# Patient Record
Sex: Male | Born: 1955 | Race: White | Hispanic: No | Marital: Married | Smoking: Never smoker
Health system: Southern US, Community
[De-identification: ages and names within clinical notes are randomized; demographics above are authoritative.]

## PROBLEM LIST (undated history)

## (undated) DIAGNOSIS — Z87442 Personal history of urinary calculi: Secondary | ICD-10-CM

## (undated) DIAGNOSIS — M1990 Unspecified osteoarthritis, unspecified site: Secondary | ICD-10-CM

## (undated) HISTORY — PX: OTHER SURGICAL HISTORY: SHX169

## (undated) HISTORY — DX: Personal history of urinary calculi: Z87.442

## (undated) HISTORY — PX: JOINT REPLACEMENT: SHX530

---

## 1997-10-16 ENCOUNTER — Ambulatory Visit (HOSPITAL_COMMUNITY): Admission: RE | Admit: 1997-10-16 | Discharge: 1997-10-16 | Payer: Self-pay | Admitting: *Deleted

## 2000-10-16 ENCOUNTER — Inpatient Hospital Stay (HOSPITAL_COMMUNITY): Admission: EM | Admit: 2000-10-16 | Discharge: 2000-10-17 | Payer: Self-pay

## 2000-10-16 ENCOUNTER — Encounter: Payer: Self-pay | Admitting: Internal Medicine

## 2002-06-25 ENCOUNTER — Encounter: Payer: Self-pay | Admitting: Family Medicine

## 2002-06-25 ENCOUNTER — Encounter: Admission: RE | Admit: 2002-06-25 | Discharge: 2002-06-25 | Payer: Self-pay | Admitting: Family Medicine

## 2003-02-16 ENCOUNTER — Ambulatory Visit (HOSPITAL_COMMUNITY): Admission: RE | Admit: 2003-02-16 | Discharge: 2003-02-16 | Payer: Self-pay | Admitting: Orthopedic Surgery

## 2003-03-17 ENCOUNTER — Ambulatory Visit (HOSPITAL_COMMUNITY): Admission: RE | Admit: 2003-03-17 | Discharge: 2003-03-17 | Payer: Self-pay | Admitting: Orthopedic Surgery

## 2003-03-17 ENCOUNTER — Ambulatory Visit (HOSPITAL_BASED_OUTPATIENT_CLINIC_OR_DEPARTMENT_OTHER): Admission: RE | Admit: 2003-03-17 | Discharge: 2003-03-17 | Payer: Self-pay | Admitting: Orthopedic Surgery

## 2005-04-01 ENCOUNTER — Encounter: Admission: RE | Admit: 2005-04-01 | Discharge: 2005-06-30 | Payer: Self-pay | Admitting: Orthopedic Surgery

## 2019-01-01 ENCOUNTER — Other Ambulatory Visit: Payer: Self-pay | Admitting: Orthopedic Surgery

## 2019-01-01 DIAGNOSIS — M19012 Primary osteoarthritis, left shoulder: Secondary | ICD-10-CM

## 2019-01-18 ENCOUNTER — Ambulatory Visit
Admission: RE | Admit: 2019-01-18 | Discharge: 2019-01-18 | Disposition: A | Discharge status: Home / Self Care | Payer: BC Managed Care – PPO | Source: Ambulatory Visit | Attending: Orthopedic Surgery | Admitting: Orthopedic Surgery

## 2019-01-18 DIAGNOSIS — M19012 Primary osteoarthritis, left shoulder: Secondary | ICD-10-CM

## 2019-11-15 NOTE — Progress Notes (Signed)
Pt. Needs PST orders for upcoming surgery.PST appointment on 11/16/19.Thanks.

## 2019-11-15 NOTE — Patient Instructions (Addendum)
DUE TO COVID-19 ONLY ONE VISITOR IS ALLOWED TO COME WITH YOU AND STAY IN THE WAITING ROOM ONLY DURING PRE OP AND PROCEDURE DAY OF SURGERY. THE 1 VISITOR  MAY VISIT WITH YOU AFTER SURGERY IN YOUR PRIVATE ROOM DURING VISITING HOURS ONLY!  YOU NEED TO HAVE A COVID 19 TEST ON: 11/16/19 @ 8:15 am , THIS TEST MUST BE DONE BEFORE SURGERY,  COVID TESTING SITE [place] [address], IT IS ON THE RIGHT GOING OUT [address] APPROXIMATELY  2 MINUTES PAST [employer] ON THE RIGHT. ONCE YOUR COVID TEST IS COMPLETED,  PLEASE BEGIN THE QUARANTINE INSTRUCTIONS AS OUTLINED IN YOUR HANDOUT.                Veer Elamin    Your procedure is scheduled on: 11/19/19   [facility] to [facility]  Entrance   Report to short stay at: 5:30 AM     Call this number if you have problems the morning of surgery [phone]    Remember: Do not eat food or drink liquids :After Midnight.   BRUSH YOUR TEETH MORNING OF SURGERY AND RINSE YOUR MOUTH OUT, NO CHEWING GUM CANDY OR MINTS.                                 You may not have any metal on your body including hair pins and              piercings  Do not wear jewelry, lotions, powders or perfumes, deodorant             Men may shave face and neck.   Do not bring valuables to the hospital. [place].  Contacts, dentures or bridgework may not be worn into surgery.  Leave suitcase in the car. After surgery it may be brought to your room.     Patients discharged the day of surgery will not be allowed to drive home. IF YOU ARE HAVING SURGERY AND GOING HOME THE SAME DAY, YOU MUST HAVE AN ADULT TO DRIVE YOU HOME AND BE WITH YOU FOR 24 HOURS. YOU MAY GO HOME BY TAXI OR UBER OR ORTHERWISE, BUT AN ADULT MUST ACCOMPANY YOU HOME AND STAY WITH YOU FOR 24 HOURS.  Name and phone number of your driver:  Special Instructions: N/A              Please read over the following fact sheets you  were given: _____________________________________________________________________          [facility] - Preparing for Surgery Before surgery, you can play an important role.  Because skin is not sterile, your skin needs to be as free of germs as possible.  You can reduce the number of germs on your skin by washing with CHG (chlorahexidine gluconate) soap before surgery.  CHG is an antiseptic cleaner which kills germs and bonds with the skin to continue killing germs even after washing. Please DO NOT use if you have an allergy to CHG or antibacterial soaps.  If your skin becomes reddened/irritated stop using the CHG and inform your nurse when you arrive at Short Stay. Do not shave (including legs and underarms) for at least 48 hours prior to the first CHG shower.  You may shave your face/neck. Please follow these instructions carefully:  1.  Shower with CHG Soap the night before surgery and the  morning of Surgery.  2.  If you choose to wash your hair, wash your hair first as usual with your  normal  shampoo.  3.  After you shampoo, rinse your hair and body thoroughly to remove the  shampoo.                           4.  Use CHG as you would any other liquid soap.  You can apply chg directly  to the skin and wash                       Gently with a scrungie or clean washcloth.  5.  Apply the CHG Soap to your body ONLY FROM THE NECK DOWN.   Do not use on face/ open                           Wound or open sores. Avoid contact with eyes, ears mouth and genitals (private parts).                       Wash face,  Genitals (private parts) with your normal soap.             6.  Wash thoroughly, paying special attention to the area where your surgery  will be performed.  7.  Thoroughly rinse your body with warm water from the neck down.  8.  DO NOT shower/wash with your normal soap after using and rinsing off  the CHG Soap.                9.  Pat yourself dry with a clean towel.            10.  Wear clean  pajamas.            11.  Place clean sheets on your bed the night of your first shower and do not  sleep with pets. Day of Surgery : Do not apply any lotions/deodorants the morning of surgery.  Please wear clean clothes to the hospital/surgery center.  FAILURE TO FOLLOW THESE INSTRUCTIONS MAY RESULT IN THE CANCELLATION OF YOUR SURGERY PATIENT SIGNATURE_________________________________  NURSE SIGNATURE__________________________________  ________________________________________________________________________

## 2019-11-16 ENCOUNTER — Other Ambulatory Visit: Payer: Self-pay

## 2019-11-16 ENCOUNTER — Ambulatory Visit: Payer: Self-pay | Admitting: Surgery

## 2019-11-16 ENCOUNTER — Other Ambulatory Visit (HOSPITAL_COMMUNITY)
Admission: RE | Admit: 2019-11-16 | Discharge: 2019-11-16 | Disposition: A | Discharge status: Home / Self Care | Payer: BC Managed Care – PPO | Source: Ambulatory Visit | Attending: Surgery | Admitting: Surgery

## 2019-11-16 ENCOUNTER — Encounter (HOSPITAL_COMMUNITY): Payer: Self-pay

## 2019-11-16 ENCOUNTER — Encounter (HOSPITAL_COMMUNITY)
Admission: RE | Admit: 2019-11-16 | Discharge: 2019-11-16 | Disposition: A | Discharge status: Home / Self Care | Payer: BC Managed Care – PPO | Source: Ambulatory Visit | Attending: Surgery | Admitting: Surgery

## 2019-11-16 DIAGNOSIS — Z20822 Contact with and (suspected) exposure to covid-19: Secondary | ICD-10-CM | POA: Insufficient documentation

## 2019-11-16 DIAGNOSIS — Z01812 Encounter for preprocedural laboratory examination: Secondary | ICD-10-CM | POA: Diagnosis not present

## 2019-11-16 LAB — CBC
HCT: 49.8 % (ref 39.0–52.0)
Hemoglobin: 17.4 g/dL — ABNORMAL HIGH (ref 13.0–17.0)
MCH: 31.8 pg (ref 26.0–34.0)
MCHC: 34.9 g/dL (ref 30.0–36.0)
MCV: 91 fL (ref 80.0–100.0)
Platelets: 248 10*3/uL (ref 150–400)
RBC: 5.47 MIL/uL (ref 4.22–5.81)
RDW: 12.8 % (ref 11.5–15.5)
WBC: 7.3 10*3/uL (ref 4.0–10.5)
nRBC: 0 % (ref 0.0–0.2)

## 2019-11-16 LAB — SARS CORONAVIRUS 2 (TAT 6-24 HRS): SARS Coronavirus 2: NEGATIVE

## 2019-11-16 NOTE — Progress Notes (Signed)
COVID Vaccine Completed: Yes Date COVID Vaccine completed: COVID vaccine manufacturer:     Pricilla Handler  PCP - Dr. Forbes Cellar Cardiologist - NO  Chest x-ray -  EKG -  Stress Test -  ECHO -  Cardiac Cath -  Pacemaker/ICD device last checked:  Sleep Study -  CPAP -   Fasting Blood Sugar -  Checks Blood Sugar _____ times a day  Blood Thinner Instructions: Aspirin Instructions: On hold since 11/14/19 Last Dose: 11/14/19  Anesthesia review:   Patient denies shortness of breath, fever, cough and chest pain at PAT appointment   Patient verbalized understanding of instructions that were given to them at the PAT appointment. Patient was also instructed that they will need to review over the PAT instructions again at home before surgery.

## 2019-11-18 ENCOUNTER — Encounter (HOSPITAL_COMMUNITY): Payer: Self-pay | Admitting: Surgery

## 2019-11-19 ENCOUNTER — Ambulatory Visit (HOSPITAL_COMMUNITY): Payer: BC Managed Care – PPO | Admitting: Anesthesiology

## 2019-11-19 ENCOUNTER — Encounter (HOSPITAL_COMMUNITY): Payer: Self-pay | Admitting: Surgery

## 2019-11-19 ENCOUNTER — Ambulatory Visit (HOSPITAL_COMMUNITY)
Admission: RE | Admit: 2019-11-19 | Discharge: 2019-11-19 | Disposition: A | Discharge status: Home / Self Care | Payer: BC Managed Care – PPO | Attending: Surgery | Admitting: Surgery

## 2019-11-19 ENCOUNTER — Encounter (HOSPITAL_COMMUNITY)
Admission: RE | Disposition: A | Discharge status: Home / Self Care | Payer: Self-pay | Source: Home / Self Care | Attending: Surgery

## 2019-11-19 DIAGNOSIS — Z791 Long term (current) use of non-steroidal anti-inflammatories (NSAID): Secondary | ICD-10-CM | POA: Insufficient documentation

## 2019-11-19 DIAGNOSIS — D1779 Benign lipomatous neoplasm of other sites: Secondary | ICD-10-CM | POA: Diagnosis not present

## 2019-11-19 DIAGNOSIS — Z880 Allergy status to penicillin: Secondary | ICD-10-CM | POA: Diagnosis not present

## 2019-11-19 DIAGNOSIS — Z9660 Presence of unspecified orthopedic joint implant: Secondary | ICD-10-CM | POA: Insufficient documentation

## 2019-11-19 DIAGNOSIS — Z79899 Other long term (current) drug therapy: Secondary | ICD-10-CM | POA: Insufficient documentation

## 2019-11-19 DIAGNOSIS — Z881 Allergy status to other antibiotic agents status: Secondary | ICD-10-CM | POA: Insufficient documentation

## 2019-11-19 DIAGNOSIS — Z7982 Long term (current) use of aspirin: Secondary | ICD-10-CM | POA: Insufficient documentation

## 2019-11-19 DIAGNOSIS — K429 Umbilical hernia without obstruction or gangrene: Secondary | ICD-10-CM | POA: Diagnosis present

## 2019-11-19 HISTORY — PX: LIPOMA EXCISION: SHX5283

## 2019-11-19 HISTORY — PX: UMBILICAL HERNIA REPAIR: SHX196

## 2019-11-19 SURGERY — REPAIR, HERNIA, UMBILICAL, ADULT
Anesthesia: General | Site: Groin

## 2019-11-19 MED ORDER — DEXMEDETOMIDINE (PRECEDEX) IN NS 20 MCG/5ML (4 MCG/ML) IV SYRINGE
PREFILLED_SYRINGE | INTRAVENOUS | Status: DC | PRN
  Administered 2019-11-19 (×2): 4 ug via INTRAVENOUS

## 2019-11-19 MED ORDER — DEXAMETHASONE SODIUM PHOSPHATE 10 MG/ML IJ SOLN
INTRAMUSCULAR | Status: DC | PRN
  Administered 2019-11-19: 10 mg via INTRAVENOUS

## 2019-11-19 MED ORDER — MIDAZOLAM HCL 2 MG/2ML IJ SOLN
INTRAMUSCULAR | Status: AC
  Filled 2019-11-19: qty 2

## 2019-11-19 MED ORDER — EPHEDRINE 5 MG/ML INJ
INTRAVENOUS | Status: AC
  Filled 2019-11-19: qty 20

## 2019-11-19 MED ORDER — ROCURONIUM BROMIDE 10 MG/ML (PF) SYRINGE
PREFILLED_SYRINGE | INTRAVENOUS | Status: DC | PRN
  Administered 2019-11-19: 70 mg via INTRAVENOUS
  Administered 2019-11-19: 5 mg via INTRAVENOUS

## 2019-11-19 MED ORDER — LACTATED RINGERS IV SOLN: INTRAVENOUS | Status: DC

## 2019-11-19 MED ORDER — MIDAZOLAM HCL 5 MG/5ML IJ SOLN
INTRAMUSCULAR | Status: DC | PRN
  Administered 2019-11-19: 2 mg via INTRAVENOUS

## 2019-11-19 MED ORDER — HYDROMORPHONE HCL 1 MG/ML IJ SOLN: 0.2500 mg | INTRAMUSCULAR | Status: DC | PRN

## 2019-11-19 MED ORDER — FENTANYL CITRATE (PF) 100 MCG/2ML IJ SOLN
INTRAMUSCULAR | Status: DC | PRN
Start: 2019-11-19 — End: 2019-11-19
  Administered 2019-11-19: 100 ug via INTRAVENOUS
  Administered 2019-11-19: 50 ug via INTRAVENOUS

## 2019-11-19 MED ORDER — CHLORHEXIDINE GLUCONATE CLOTH 2 % EX PADS: 6.0000 | MEDICATED_PAD | Freq: Once | CUTANEOUS | Status: DC

## 2019-11-19 MED ORDER — ACETAMINOPHEN 160 MG/5ML PO SOLN: 325.0000 mg | ORAL | Status: DC | PRN

## 2019-11-19 MED ORDER — PROPOFOL 10 MG/ML IV BOLUS
INTRAVENOUS | Status: DC | PRN
  Administered 2019-11-19: 180 mg via INTRAVENOUS

## 2019-11-19 MED ORDER — BUPIVACAINE-EPINEPHRINE 0.25% -1:200000 IJ SOLN
INTRAMUSCULAR | Status: DC | PRN
  Administered 2019-11-19: 30 mL

## 2019-11-19 MED ORDER — ROCURONIUM BROMIDE 10 MG/ML (PF) SYRINGE
PREFILLED_SYRINGE | INTRAVENOUS | Status: AC
  Filled 2019-11-19: qty 20

## 2019-11-19 MED ORDER — VANCOMYCIN HCL IN DEXTROSE 1-5 GM/200ML-% IV SOLN
1000.0000 mg | INTRAVENOUS | Status: AC
  Administered 2019-11-19: 1000 mg via INTRAVENOUS
  Filled 2019-11-19: qty 200

## 2019-11-19 MED ORDER — ONDANSETRON HCL 4 MG/2ML IJ SOLN: 4.0000 mg | Freq: Once | INTRAMUSCULAR | Status: DC | PRN

## 2019-11-19 MED ORDER — FENTANYL CITRATE (PF) 250 MCG/5ML IJ SOLN
INTRAMUSCULAR | Status: AC
  Filled 2019-11-19: qty 5

## 2019-11-19 MED ORDER — HYDROMORPHONE HCL 2 MG PO TABS: 2.0000 mg | ORAL_TABLET | Freq: Four times a day (QID) | ORAL | 0 refills | Status: AC | PRN

## 2019-11-19 MED ORDER — LIDOCAINE 2% (20 MG/ML) 5 ML SYRINGE
INTRAMUSCULAR | Status: DC | PRN
  Administered 2019-11-19: 100 mg via INTRAVENOUS

## 2019-11-19 MED ORDER — ACETAMINOPHEN 325 MG PO TABS: 325.0000 mg | ORAL_TABLET | ORAL | Status: DC | PRN

## 2019-11-19 MED ORDER — ONDANSETRON HCL 4 MG/2ML IJ SOLN
INTRAMUSCULAR | Status: DC | PRN
  Administered 2019-11-19: 4 mg via INTRAVENOUS

## 2019-11-19 MED ORDER — ORAL CARE MOUTH RINSE: 15.0000 mL | Freq: Once | OROMUCOSAL | Status: AC

## 2019-11-19 MED ORDER — DEXMEDETOMIDINE (PRECEDEX) IN NS 20 MCG/5ML (4 MCG/ML) IV SYRINGE
PREFILLED_SYRINGE | INTRAVENOUS | Status: AC
  Filled 2019-11-19: qty 5

## 2019-11-19 MED ORDER — LIDOCAINE 2% (20 MG/ML) 5 ML SYRINGE
INTRAMUSCULAR | Status: AC
  Filled 2019-11-19: qty 10

## 2019-11-19 MED ORDER — PHENYLEPHRINE HCL (PRESSORS) 10 MG/ML IV SOLN
INTRAVENOUS | Status: AC
  Filled 2019-11-19: qty 1

## 2019-11-19 MED ORDER — EPHEDRINE SULFATE-NACL 50-0.9 MG/10ML-% IV SOSY
PREFILLED_SYRINGE | INTRAVENOUS | Status: DC | PRN
  Administered 2019-11-19: 10 mg via INTRAVENOUS
  Administered 2019-11-19 (×3): 5 mg via INTRAVENOUS

## 2019-11-19 MED ORDER — 0.9 % SODIUM CHLORIDE (POUR BTL) OPTIME
TOPICAL | Status: DC | PRN
  Administered 2019-11-19: 1000 mL

## 2019-11-19 MED ORDER — BUPIVACAINE-EPINEPHRINE (PF) 0.25% -1:200000 IJ SOLN
INTRAMUSCULAR | Status: AC
  Filled 2019-11-19: qty 30

## 2019-11-19 MED ORDER — SUGAMMADEX SODIUM 200 MG/2ML IV SOLN
INTRAVENOUS | Status: DC | PRN
  Administered 2019-11-19: 200 mg via INTRAVENOUS

## 2019-11-19 MED ORDER — CHLORHEXIDINE GLUCONATE 0.12 % MT SOLN
15.0000 mL | Freq: Once | OROMUCOSAL | Status: AC
  Administered 2019-11-19: 15 mL via OROMUCOSAL

## 2019-11-19 MED ORDER — BUPIVACAINE LIPOSOME 1.3 % IJ SUSP
20.0000 mL | Freq: Once | INTRAMUSCULAR | Status: AC
  Administered 2019-11-19: 20 mL
  Filled 2019-11-19: qty 20

## 2019-11-19 MED ORDER — PROPOFOL 10 MG/ML IV BOLUS
INTRAVENOUS | Status: AC
  Filled 2019-11-19: qty 40

## 2019-11-19 SURGICAL SUPPLY — 41 items
BENZOIN TINCTURE PRP APPL 2/3 (GAUZE/BANDAGES/DRESSINGS) ×4 IMPLANT
BLADE SURG 15 STRL LF DISP TIS (BLADE) ×2 IMPLANT
BLADE SURG 15 STRL SS (BLADE) ×4
CHLORAPREP W/TINT 26 (MISCELLANEOUS) ×4 IMPLANT
CLOSURE WOUND 1/2 X4 (GAUZE/BANDAGES/DRESSINGS) ×1
COVER SURGICAL LIGHT HANDLE (MISCELLANEOUS) ×4 IMPLANT
COVER WAND RF STERILE (DRAPES) IMPLANT
DECANTER SPIKE VIAL GLASS SM (MISCELLANEOUS) ×4 IMPLANT
DERMABOND ADVANCED (GAUZE/BANDAGES/DRESSINGS) ×4
DERMABOND ADVANCED .7 DNX12 (GAUZE/BANDAGES/DRESSINGS) ×4 IMPLANT
DRAIN PENROSE 0.5X18 (DRAIN) ×4 IMPLANT
DRAPE LAPAROSCOPIC ABDOMINAL (DRAPES) ×4 IMPLANT
DRSG TEGADERM 4X4.75 (GAUZE/BANDAGES/DRESSINGS) ×4 IMPLANT
ELECT REM PT RETURN 15FT ADLT (MISCELLANEOUS) ×4 IMPLANT
GAUZE SPONGE 4X4 12PLY STRL (GAUZE/BANDAGES/DRESSINGS) ×4 IMPLANT
GLOVE BIO SURGEON STRL SZ 6 (GLOVE) ×4 IMPLANT
GLOVE INDICATOR 6.5 STRL GRN (GLOVE) ×4 IMPLANT
GOWN STRL REUS W/TWL LRG LVL3 (GOWN DISPOSABLE) ×4 IMPLANT
GOWN STRL REUS W/TWL XL LVL3 (GOWN DISPOSABLE) ×4 IMPLANT
KIT BASIN OR (CUSTOM PROCEDURE TRAY) ×4 IMPLANT
KIT TURNOVER KIT A (KITS) IMPLANT
NEEDLE HYPO 22GX1.5 SAFETY (NEEDLE) ×4 IMPLANT
PACK BASIC VI WITH GOWN DISP (CUSTOM PROCEDURE TRAY) ×4 IMPLANT
PENCIL SMOKE EVACUATOR (MISCELLANEOUS) ×4 IMPLANT
SPONGE LAP 4X18 RFD (DISPOSABLE) ×4 IMPLANT
STRIP CLOSURE SKIN 1/2X4 (GAUZE/BANDAGES/DRESSINGS) ×3 IMPLANT
SUT ETHIBOND 0 MO6 C/R (SUTURE) ×4 IMPLANT
SUT MNCRL AB 4-0 PS2 18 (SUTURE) ×4 IMPLANT
SUT PDS AB 0 CT1 36 (SUTURE) ×8 IMPLANT
SUT SILK 3 0 (SUTURE) ×4
SUT SILK 3-0 18XBRD TIE 12 (SUTURE) ×2 IMPLANT
SUT VIC AB 3-0 SH 27 (SUTURE) ×8
SUT VIC AB 3-0 SH 27XBRD (SUTURE) ×4 IMPLANT
SUT VICRYL 0 UR6 27IN ABS (SUTURE) IMPLANT
SUT VICRYL 3 0 BR 18  UND (SUTURE) ×4
SUT VICRYL 3 0 BR 18 UND (SUTURE) ×2 IMPLANT
SYR 20ML LL LF (SYRINGE) ×4 IMPLANT
SYR CONTROL 10ML LL (SYRINGE) ×4 IMPLANT
TOWEL OR 17X26 10 PK STRL BLUE (TOWEL DISPOSABLE) ×4 IMPLANT
TOWEL OR NON WOVEN STRL DISP B (DISPOSABLE) ×4 IMPLANT
YANKAUER SUCT BULB TIP 10FT TU (MISCELLANEOUS) ×4 IMPLANT

## 2019-11-19 NOTE — Transfer of Care (Signed)
Immediate Anesthesia Transfer of Care Note  Patient: Jerome Shepard  Procedure(s) Performed: Procedure(s): OPEN UMBILICAL HERNIA REPAIR (N/A) EXCISION OF LEFT INGUINAL LIPOMA (Left)  Patient Location: PACU  Anesthesia Type:General  Level of Consciousness:  sedated, patient cooperative and responds to stimulation  Airway & Oxygen Therapy:Patient Spontanous Breathing and Patient connected to face mask oxgen  Post-op Assessment:  Report given to PACU RN and Post -op Vital signs reviewed and stable  Post vital signs:  Reviewed and stable  Last Vitals:  Vitals:   11/19/19 0602 11/19/19 0845  BP: (!) 145/100 135/85  Pulse: 70 63  Resp: 18 12  Temp: 36.7 C 36.6 C  SpO2: 06% 893%    Complications: No apparent anesthesia complications

## 2019-11-19 NOTE — Brief Op Note (Signed)
11/19/2019  8:42 AM  PATIENT:  Baltazar Apo  64 y.o. male  PRE-OPERATIVE DIAGNOSIS:  UMBILICAL HERNIA, LEFT INGUINAL LIPOMA  POST-OPERATIVE DIAGNOSIS:  UMBILICAL HERNIA, LEFT INGUINAL LIPOMA  PROCEDURE:  Procedure(s): OPEN UMBILICAL HERNIA REPAIR (N/A) EXCISION OF LEFT INGUINAL LIPOMA (Left)  SURGEON:  Surgeon(s) and Role:    * Dwan Bolt, MD - Primary  ANESTHESIA:   general  EBL: minimal  BLOOD ADMINISTERED:none  DRAINS: none   LOCAL MEDICATIONS USED:  OTHER exparel  SPECIMEN:  Source of Specimen:  left inguinal lipoma  DISPOSITION OF SPECIMEN:  PATHOLOGY  COUNTS:  YES  TOURNIQUET:  * No tourniquets in log *  DICTATION: .Note written in EPIC  PLAN OF CARE: Discharge to home after PACU  PATIENT DISPOSITION:  PACU - hemodynamically stable.   Delay start of Pharmacological VTE agent (>24hrs) due to surgical blood loss or risk of bleeding: not applicable

## 2019-11-19 NOTE — Anesthesia Preprocedure Evaluation (Addendum)
Anesthesia Evaluation  Patient identified by MRN, date of birth, ID band Patient awake    Reviewed: Patient's Chart, lab work & pertinent test results  Airway Mallampati: II  TM Distance: >3 FB Neck ROM: Full    Dental  (+) Teeth Intact   Pulmonary neg pulmonary ROS,    Pulmonary exam normal        Cardiovascular negative cardio ROS   Rhythm:Regular Rate:Normal     Neuro/Psych negative neurological ROS  negative psych ROS   GI/Hepatic Neg liver ROS, GERD  Medicated,Umbilical hernia, left inguinal lipoma   Endo/Other    Renal/GU negative Renal ROS Bladder dysfunction      Musculoskeletal negative musculoskeletal ROS (+)   Abdominal (+)  Abdomen: soft. Bowel sounds: normal.  Peds  Hematology negative hematology ROS (+)   Anesthesia Other Findings   Reproductive/Obstetrics                            Anesthesia Physical Anesthesia Plan  ASA: II  Anesthesia Plan: General   Post-op Pain Management:    Induction: Intravenous  PONV Risk Score and Plan: 2 and Ondansetron, Dexamethasone and Treatment may vary due to age or medical condition  Airway Management Planned: Mask, LMA and Oral ETT  Additional Equipment: None  Intra-op Plan:   Post-operative Plan: Extubation in OR  Informed Consent: I have reviewed the patients History and Physical, chart, labs and discussed the procedure including the risks, benefits and alternatives for the proposed anesthesia with the patient or authorized representative who has indicated his/her understanding and acceptance.     Dental advisory given  Plan Discussed with: CRNA and Surgeon  Anesthesia Plan Comments: (Lab Results      Component                Value               Date                      WBC                      7.3                 11/16/2019                HGB                      17.4 (H)            11/16/2019                 HCT                      49.8                11/16/2019                MCV                      91.0                11/16/2019                PLT                      248  11/16/2019           Covid-19 Nucleic Acid Test Results Lab Results      Component                Value               Date                      [id number]              NEGATIVE            11/16/2019          )        Anesthesia Quick Evaluation

## 2019-11-19 NOTE — Op Note (Addendum)
Date: 11/19/19  Patient: Jerome Shepard MRN: [id number]  Preoperative Diagnosis: Umbilical hernia, left inguinal lipoma Postoperative Diagnosis: Same  Procedure: Open umbilical hernia repair, excision of left inguinal lipoma  Surgeon: Michaelle Birks, MD  EBL: Minimal  Anesthesia: General  Specimens: Left inguinal lipoma  Findings: Small fat-containing umbilical hernia approximately 1cm in diameter, repaired primarily. Subcutaneous left inguinal lipoma, 6cm in diameter.  Procedure details: Informed consent was obtained in the preoperative area prior to the procedure. The patient was brought to the operating room and placed on the table in the supine position. General anesthesia was induced and appropriate lines and drains were placed for intraoperative monitoring. The abdomen and left groin were prepped and draped in the usual sterile fashion. A pre-procedure timeout was taken verifying patient identity, surgical site and procedure to be performed.  A curvilinear infraumbilical skin incision was made sharply, and the subcutaneous tissue was divided with cautery to expose the fascia. The umbilical stalk was circumferentially dissected out using blunt dissection, and the overlying skin was taken off the stalk and underlying hernia sac using cautery. The hernia sac was opened and dissected off the fascia using cautery. Incarcerated fat was contained within the sac but no bowel. The fascial defect was approximately 1cm in diameter. The surrounding fascia was cleared of fat, and then the defect was closed primarily with interrupted 0 Ethibond suture. The wound was irrigated and hemostasis was achieved. The umbilical skin was tacked down to the fascia using a 3-0 vicryl suture. The subcutaneous tissue was reapproximated with interrupted 3-0 vicryl suture and the skin was closed with a running subcuticular 4-0 monocryl suture. Dermabond was applied, followed by a pressure dressing.  Next a transverse  incision was made over the mass in the left groin. The subcutaneous tissue was divided with cautery, and the underlying mass was immediately identified just deep to the dermis. The mass was fatty and well-circumscribed, consistent with a benign lipoma, and was dissected out of the surrounding tissue using blunt dissection and cautery. The mass was sent for routine pathology. The wound was irrigated and hemostasis was achieved. A deep layer of 3-0 vicryl suture was placed to reapproximate the subcutaneous tissue. The skin was closed with a running subcuticular 4-0 monocryl suture. Dermabond was applied.   All counts were correct x2 at the end of the procedure. The patient was extubated and taken to PACU in stable condition.  Michaelle Birks, MD 11/19/19 8:56 AM

## 2019-11-19 NOTE — Anesthesia Procedure Notes (Signed)
Procedure Name: Intubation Date/Time: 11/19/2019 7:47 AM Performed by: Lavina Hamman, CRNA Pre-anesthesia Checklist: Patient identified, Emergency Drugs available, Suction available, Patient being monitored and Timeout performed Patient Re-evaluated:Patient Re-evaluated prior to induction Oxygen Delivery Method: Circle system utilized Preoxygenation: Pre-oxygenation with 100% oxygen Induction Type: IV induction Ventilation: Mask ventilation without difficulty Laryngoscope Size: Mac and 4 Grade View: Grade II Tube type: Oral Tube size: 7.5 mm Number of attempts: 1 Airway Equipment and Method: Stylet Placement Confirmation: ETT inserted through vocal cords under direct vision,  positive ETCO2,  CO2 detector and breath sounds checked- equal and bilateral Secured at: 23 cm Tube secured with: Tape Dental Injury: Teeth and Oropharynx as per pre-operative assessment  Comments: ATOI, Anterior view without cricoid pressure, G2 with cricoid pressure.

## 2019-11-19 NOTE — Discharge Instructions (Signed)
SURGERY DISCHARGE INSTRUCTIONS  Activity . No heavy lifting greater than 10 pounds for 4 weeks after surgery. Madaline Brilliant to shower, but do not bathe or submerge incisions underwater. . Do not drive while taking narcotic pain medication.  Wound Care . Your incisions are covered with skin glue called Dermabond. This will peel off on its own over time. . You may remove the dressing over your hernia incision in 48 hours. It is then ok to take a shower. . You may shower and allow warm soapy water to run over your incisions. Gently pat dry. . Do not submerge your incision underwater. . Monitor your incision for any new redness, tenderness, or drainage.  When to Call us: Marland Kitchen Fever greater than 100.5 . New redness, drainage, or swelling at incision site . Severe pain, nausea, or vomiting . Jaundice (yellowing of the whites of the eyes or skin)  Follow-up You have an appointment scheduled with Dr. Zenia Resides on Tuesday, Oct. 26, 2021 at 9:30am. This will be at the [facility] Surgery office at [address] [place], [place], [place]. Please arrive at least 15 minutes prior to your scheduled appointment.  For questions or concerns, please call the office at [phone].

## 2019-11-19 NOTE — Anesthesia Postprocedure Evaluation (Signed)
Anesthesia Post Note  Patient: Jerome Shepard  Procedure(s) Performed: OPEN UMBILICAL HERNIA REPAIR (N/A Abdomen) EXCISION OF LEFT INGUINAL LIPOMA (Left Groin)     Patient location during evaluation: PACU Anesthesia Type: General Level of consciousness: awake and alert Pain management: pain level controlled Vital Signs Assessment: post-procedure vital signs reviewed and stable Respiratory status: spontaneous breathing, nonlabored ventilation, respiratory function stable and patient connected to nasal cannula oxygen Cardiovascular status: blood pressure returned to baseline and stable Postop Assessment: no apparent nausea or vomiting Anesthetic complications: no   No complications documented.  Last Vitals:  Vitals:   11/19/19 0915 11/19/19 0920  BP: 135/81 138/79  Pulse: 65 71  Resp: 12 19  Temp: 36.6 C 36.6 C  SpO2: 95% 94%    Last Pain:  Vitals:   11/19/19 0920  TempSrc:   PainSc: 0-No pain                 Belenda Cruise P Iona Stay

## 2019-11-19 NOTE — H&P (Signed)
Jerome Shepard is an 64 y.o. male.   Chief Complaint: hernia HPI: Jerome Shepard is a 64 yo male who presents with a symptomatic umbilical hernia. He has been having periumbilical pain for several weeks associated with an umbilical bulge, which worsens when standing for long periods of time. It has limited his ability to work. He was seen in clinic last month and presents for elective repair today. He also has a left inguinal lipoma that he would like to have removed.  Past Medical History:  Diagnosis Date  . History of kidney stones     Past Surgical History:  Procedure Laterality Date  . JOINT REPLACEMENT     x 3 (1/20.12/20)    History reviewed. No pertinent family history. Social History:  reports that he has never smoked. He has never used smokeless tobacco. He reports current alcohol use. He reports that he does not use drugs.  Allergies:  Allergies  Allergen Reactions  . Cephalosporins Rash  . Penicillin G Rash    Medications Prior to Admission  Medication Sig Dispense Refill  . acetaminophen (TYLENOL) 500 MG tablet Take 1,000 mg by mouth every 6 (six) hours as needed for moderate pain.    Marland Kitchen aspirin EC 81 MG tablet Take 81 mg by mouth daily. Swallow whole.     . cholecalciferol (VITAMIN D3) 25 MCG (1000 UNIT) tablet Take 1,000 Units by mouth daily.    . diclofenac (VOLTAREN) 75 MG EC tablet Take 75 mg by mouth 2 (two) times daily.     . hydroxypropyl methylcellulose / hypromellose (ISOPTO TEARS / GONIOVISC) 2.5 % ophthalmic solution Place 1 drop into both eyes 2 (two) times daily as needed for dry eyes.    . Multiple Vitamin (MULTIVITAMIN WITH MINERALS) TABS tablet Take 1 tablet by mouth daily.    . Omega-3 Fatty Acids (FISH OIL) 1000 MG CAPS Take 1,000 mg by mouth daily.    . pantoprazole (PROTONIX) 40 MG tablet Take 40 mg by mouth 2 (two) times daily.    . tamsulosin (FLOMAX) 0.4 MG CAPS capsule Take 0.4 mg by mouth daily.    . traMADol (ULTRAM) 50 MG tablet Take 50 mg by mouth  2 (two) times daily as needed for moderate pain.      No results found for this or any previous visit (from the past 48 hour(s)). No results found.  Review of Systems  Constitutional: Negative for chills and fever.  HENT: Negative for congestion and facial swelling.   Respiratory: Negative for cough and shortness of breath.   Gastrointestinal: Positive for abdominal pain and constipation. Negative for nausea and vomiting.  Skin: Negative for pallor and rash.  Allergic/Immunologic: Negative for immunocompromised state.  Neurological: Negative for seizures and speech difficulty.  Hematological: Negative for adenopathy.  Psychiatric/Behavioral: Negative for agitation and confusion.    Blood pressure (!) 145/100, pulse 70, temperature 98 F (36.7 C), temperature source Oral, resp. rate 18, weight 93.9 kg, SpO2 96 %. Physical Exam Constitutional:      General: He is not in acute distress.    Appearance: Normal appearance.  HENT:     Head: Normocephalic and atraumatic.  Eyes:     General: No scleral icterus.    Extraocular Movements: Extraocular movements intact.     Conjunctiva/sclera: Conjunctivae normal.  Pulmonary:     Effort: Pulmonary effort is normal. No respiratory distress.  Abdominal:     General: There is no distension.     Palpations: Abdomen is soft.  Tenderness: There is no abdominal tenderness.     Comments: Small umbilical hernia  Genitourinary:    Comments: Left inguinal lipoma, soft and freely mobile Musculoskeletal:        General: No swelling or deformity. Normal range of motion.  Skin:    General: Skin is warm and dry.     Coloration: Skin is not jaundiced.     Findings: No erythema.  Neurological:     General: No focal deficit present.     Mental Status: He is alert and oriented to person, place, and time. Mental status is at baseline.  Psychiatric:        Mood and Affect: Mood normal.        Behavior: Behavior normal.        Thought Content:  Thought content normal.      Assessment/Plan 64 yo male with umbilical hernia and left inguinal lipoma. - Proceed to OR for umbilical hernia repair and excision of lipoma. Surgical site confirmed and marked. - Informed consent completed, all questions answered - Discharge home postoperatively  Dwan Bolt, MD 11/19/2019, 7:10 AM

## 2019-11-20 ENCOUNTER — Encounter (HOSPITAL_COMMUNITY): Payer: Self-pay | Admitting: Surgery

## 2019-11-22 LAB — SURGICAL PATHOLOGY

## 2019-12-21 ENCOUNTER — Inpatient Hospital Stay
Admission: RE | Admit: 2019-12-21 | Discharge: 2019-12-21 | Disposition: A | Discharge status: Home / Self Care | Payer: Self-pay | Source: Ambulatory Visit | Attending: Surgery | Admitting: Surgery

## 2019-12-21 ENCOUNTER — Other Ambulatory Visit (HOSPITAL_COMMUNITY): Payer: Self-pay | Admitting: Surgery

## 2019-12-21 DIAGNOSIS — C801 Malignant (primary) neoplasm, unspecified: Secondary | ICD-10-CM

## 2021-05-08 NOTE — Progress Notes (Signed)
Sent message, via epic in basket, requesting orders in epic from surgeon.  

## 2021-05-08 NOTE — Progress Notes (Addendum)
Anesthesia Review: ? ?PCP: DR Blanchard Mane  ?Cardiologist : none  ?Chest x-ray : ?EKG : ?Echo : ?Stress test: ?Cardiac Cath :  ?Activity level: can do a flight of stiars without difficutly  ?Sleep Study/ CPAP : none  ?Fasting Blood Sugar :      / Checks Blood Sugar -- times a day:   ?Blood Thinner/ Instructions /Last Dose: ?ASA / Instructions/ Last Dose :   ?81 mg aspirin  ?ORders requested x 2 on 05/08/21.  NO orders at preop.  ?Pt has preop appt with surgeon after preop appt at [facility] office and spoke with Caryl Pina, [place], and informed that pt was seen on preop appt this am with no orders and asked if she would please let PA be awaare on 05/11/25 that pt does not have preop appt.  PT seems them today on 05/11/25.  Ashely stated she would let PA know.  ?Orders placed in epic after preop appt.  ?

## 2021-05-11 ENCOUNTER — Encounter (HOSPITAL_COMMUNITY)
Admission: RE | Admit: 2021-05-11 | Discharge: 2021-05-11 | Disposition: A | Discharge status: Home / Self Care | Payer: BC Managed Care – PPO | Source: Ambulatory Visit | Attending: Specialist | Admitting: Specialist

## 2021-05-11 ENCOUNTER — Encounter (HOSPITAL_COMMUNITY): Payer: Self-pay

## 2021-05-11 ENCOUNTER — Other Ambulatory Visit: Payer: Self-pay

## 2021-05-11 VITALS — BP 146/83 | HR 72 | Temp 98.9°F | Resp 16 | Ht 70.0 in | Wt 198.0 lb

## 2021-05-11 DIAGNOSIS — Z01818 Encounter for other preprocedural examination: Secondary | ICD-10-CM

## 2021-05-11 DIAGNOSIS — Z01812 Encounter for preprocedural laboratory examination: Secondary | ICD-10-CM | POA: Insufficient documentation

## 2021-05-11 HISTORY — DX: Unspecified osteoarthritis, unspecified site: M19.90

## 2021-05-11 LAB — CBC
HCT: 47.5 % (ref 39.0–52.0)
Hemoglobin: 16.6 g/dL (ref 13.0–17.0)
MCH: 32 pg (ref 26.0–34.0)
MCHC: 34.9 g/dL (ref 30.0–36.0)
MCV: 91.5 fL (ref 80.0–100.0)
Platelets: 270 10*3/uL (ref 150–400)
RBC: 5.19 MIL/uL (ref 4.22–5.81)
RDW: 13 % (ref 11.5–15.5)
WBC: 7.1 10*3/uL (ref 4.0–10.5)
nRBC: 0 % (ref 0.0–0.2)

## 2021-05-11 LAB — SURGICAL PCR SCREEN
MRSA, PCR: NEGATIVE
Staphylococcus aureus: POSITIVE — AB

## 2021-05-11 NOTE — Progress Notes (Signed)
DUE TO COVID-19 ONLY ONE VISITOR IS ALLOWED TO COME WITH YOU AND STAY IN THE WAITING ROOM ONLY DURING PRE OP AND PROCEDURE DAY OF SURGERY.  2 VISITOR  MAY VISIT WITH YOU AFTER SURGERY IN YOUR PRIVATE ROOM DURING VISITING HOURS ONLY! ?YOU MAY HAVE ONE PERSON SPEND THE NITE WITH YOU IN YOUR ROOM AFTER SURGERY.   ? ? ? Your procedure is scheduled on:  ?  05/30/21  ? Report to [facility] Main  Entrance ? ? Report to admitting at       0900         AM ?DO NOT BRING INSURANCE CARD, PICTURE ID OR WALLET DAY OF SURGERY.  ?  ? ? Call this number if you have problems the morning of surgery [phone]  ? ? REMEMBER: NO  SOLID FOODS , CANDY, GUM OR MINTS AFTER MIDNITE THE NITE BEFORE SURGERY .       Marland Kitchen CLEAR LIQUIDS UNTIL     0825am             DAY OF SURGERY.     ? ? ?CLEAR LIQUID DIET ? ? ?Foods Allowed      ?WATER ?BLACK COFFEE ( SUGAR OK, NO MILK, CREAM OR CREAMER) REGULAR AND DECAF  ?TEA ( SUGAR OK NO MILK, CREAM, OR CREAMER) REGULAR AND DECAF  ?PLAIN JELLO ( NO RED)  ?FRUIT ICES ( NO RED, NO FRUIT PULP)  ?POPSICLES ( NO RED)  ?JUICE- APPLE, WHITE GRAPE AND WHITE CRANBERRY  ?SPORT DRINK LIKE GATORADE ( NO RED)  ?CLEAR BROTH ( VEGETABLE , CHICKEN OR BEEF)                                                               ? ?    ? ?BRUSH YOUR TEETH MORNING OF SURGERY AND RINSE YOUR MOUTH OUT, NO CHEWING GUM CANDY OR MINTS. ?  ? ? Take these medicines the morning of surgery with A SIP OF WATER:  flomax  ? ? ?DO NOT TAKE ANY DIABETIC MEDICATIONS DAY OF YOUR SURGERY ?                  ?            You may not have any metal on your body including hair pins and  ?            piercings  Do not wear jewelry, make-up, lotions, powders or perfumes, deodorant ?            Do not wear nail polish on your fingernails.   ?           IF YOU ARE A MALE AND WANT TO SHAVE UNDER ARMS OR LEGS PRIOR TO SURGERY YOU MUST DO SO AT LEAST 48 HOURS PRIOR TO SURGERY.  ?            Men may shave face and neck. ? ? Do not bring valuables to the  hospital. [facility] NOT ?            RESPONSIBLE   FOR VALUABLES. ? Contacts, dentures or bridgework may not be worn into surgery. ? Leave suitcase in the car. After surgery it may be brought to your room. ? ?  ? Patients  discharged the day of surgery will not be allowed to drive home. IF YOU ARE HAVING SURGERY AND GOING HOME THE SAME DAY, YOU MUST HAVE AN ADULT TO DRIVE YOU HOME AND BE WITH YOU FOR 24 HOURS. YOU MAY GO HOME BY TAXI OR UBER OR ORTHERWISE, BUT AN ADULT MUST ACCOMPANY YOU HOME AND STAY WITH YOU FOR 24 HOURS. ?  ? ?            Please read over the following fact sheets you were given: ?_____________________________________________________________________ ? ?[facility] - Preparing for Surgery ?Before surgery, you can play an important role.  Because skin is not sterile, your skin needs to be as free of germs as possible.  You can reduce the number of germs on your skin by washing with CHG (chlorahexidine gluconate) soap before surgery.  CHG is an antiseptic cleaner which kills germs and bonds with the skin to continue killing germs even after washing. ?Please DO NOT use if you have an allergy to CHG or antibacterial soaps.  If your skin becomes reddened/irritated stop using the CHG and inform your nurse when you arrive at Short Stay. ?Do not shave (including legs and underarms) for at least 48 hours prior to the first CHG shower.  You may shave your face/neck. ?Please follow these instructions carefully: ? 1.  Shower with CHG Soap the night before surgery and the  morning of Surgery. ? 2.  If you choose to wash your hair, wash your hair first as usual with your  normal  shampoo. ? 3.  After you shampoo, rinse your hair and body thoroughly to remove the  shampoo.                           4.  Use CHG as you would any other liquid soap.  You can apply chg directly  to the skin and wash  ?                     Gently with a scrungie or clean washcloth. ? 5.  Apply the CHG Soap to your body ONLY FROM  THE NECK DOWN.   Do not use on face/ open      ?                     Wound or open sores. Avoid contact with eyes, ears mouth and genitals (private parts).  ?                     Production manager,  Genitals (private parts) with your normal soap. ?            6.  Wash thoroughly, paying special attention to the area where your surgery  will be performed. ? 7.  Thoroughly rinse your body with warm water from the neck down. ? 8.  DO NOT shower/wash with your normal soap after using and rinsing off  the CHG Soap. ?               9.  Pat yourself dry with a clean towel. ?           10.  Wear clean pajamas. ?           11.  Place clean sheets on your bed the night of your first shower and do not  sleep with pets. ?Day of Surgery : ?Do not apply any lotions/deodorants the morning  of surgery.  Please wear clean clothes to the hospital/surgery center. ? ?FAILURE TO FOLLOW THESE INSTRUCTIONS MAY RESULT IN THE CANCELLATION OF YOUR SURGERY ?PATIENT SIGNATURE_________________________________ ? ?NURSE SIGNATURE__________________________________ ? ?________________________________________________________________________  ? ? ?           ?

## 2021-05-11 NOTE — H&P (Signed)
Patellofemoral KNEE arthroplasy ADMISSION H&P ? ?Patient is being admitted for left knee arthroscopy, PMM and patellofemoral knee arthroplasty. ? ?Subjective: ? ?Chief Complaint:left knee pain. ? ?HPI: Jerome Shepard, 66 y.o. male, has a history of pain and functional disability in the left knee due to arthritis and has failed non-surgical conservative treatments for greater than 12 weeks to includeNSAID's and/or analgesics, corticosteriod injections, viscosupplementation injections, and flexibility and strengthening excercises.  Onset of symptoms was gradual, starting 2 years ago with gradually worsening course since that time. The patient noted no past surgery on the left knee(s).  Patient currently rates pain in the left knee(s) at 8 out of 10 with activity. Patient has night pain, worsening of pain with activity and weight bearing, pain that interferes with activities of daily living, pain with passive range of motion, crepitus, and joint swelling.  Patient has evidence of subchondral sclerosis, periarticular osteophytes, and joint space narrowing by imaging studies. This patient has had  no previous surgery . There is no active infection. ? ?There are no problems to display for this patient. ? ?Past Medical History:  ?Diagnosis Date  ? Arthritis   ? History of kidney stones   ?  ?Past Surgical History:  ?Procedure Laterality Date  ? JOINT REPLACEMENT    ? x 3 (1/20.12/20)  ? left shoulder replacement     ? LIPOMA EXCISION Left 11/19/2019  ? Procedure: EXCISION OF LEFT INGUINAL LIPOMA;  Surgeon: Dwan Bolt, MD;  Location: [facility] ORS;  Service: General;  Laterality: Left;  ? thumb joint replacement     ? tumb joint replacement - bilaeral     ? UMBILICAL HERNIA REPAIR N/A 11/19/2019  ? Procedure: OPEN UMBILICAL HERNIA REPAIR;  Surgeon: Dwan Bolt, MD;  Location: [facility] ORS;  Service: General;  Laterality: N/A;  ?  ?No current facility-administered medications for this encounter.  ? ?Current Outpatient Medications   ?Medication Sig Dispense Refill Last Dose  ? acetaminophen (TYLENOL) 500 MG tablet Take 1,000 mg by mouth every 6 (six) hours as needed for moderate pain.     ? aspirin EC 81 MG tablet Take 81 mg by mouth daily. Swallow whole.      ? Cholecalciferol (VITAMIN D) 50 MCG (2000 UT) tablet Take 2,000 Units by mouth daily.     ? diclofenac (VOLTAREN) 75 MG EC tablet Take 75 mg by mouth 2 (two) times daily.      ? hydroxypropyl methylcellulose / hypromellose (ISOPTO TEARS / GONIOVISC) 2.5 % ophthalmic solution Place 1 drop into both eyes 2 (two) times daily as needed for dry eyes.     ? metaxalone (SKELAXIN) 800 MG tablet Take 400 mg by mouth daily as needed for muscle spasms.     ? Multiple Vitamin (MULTIVITAMIN WITH MINERALS) TABS tablet Take 1 tablet by mouth daily.     ? Omega-3 Fatty Acids (FISH OIL ULTRA) 1400 MG CAPS Take 1,400 mg by mouth daily.     ? tamsulosin (FLOMAX) 0.4 MG CAPS capsule Take 0.4 mg by mouth daily.     ? traMADol (ULTRAM) 50 MG tablet Take 50 mg by mouth every 6 (six) hours as needed for moderate pain.     ? ?Allergies  ?Allergen Reactions  ? Cephalosporins Rash  ? Penicillin G Rash  ?  ?Social History  ? ?Tobacco Use  ? Smoking status: Never  ? Smokeless tobacco: Never  ?Substance Use Topics  ? Alcohol use: Yes  ?  Comment: occas.  ?  ?No  family history on file.  ? ?Review of Systems  ?All other systems reviewed and are negative. ? ?Objective: ? ?Physical Exam ?Constitutional:   ?   Appearance: Normal appearance. He is normal weight.  ?HENT:  ?   Head: Normocephalic and atraumatic.  ?Eyes:  ?   Extraocular Movements: Extraocular movements intact.  ?   Conjunctiva/sclera: Conjunctivae normal.  ?   Pupils: Pupils are equal, round, and reactive to light.  ?Neck:  ?   Vascular: No carotid bruit.  ?Cardiovascular:  ?   Rate and Rhythm: Normal rate and regular rhythm.  ?   Pulses: Normal pulses.  ?   Heart sounds: Normal heart sounds.  ?Pulmonary:  ?   Effort: Pulmonary effort is normal.  ?   Breath  sounds: Normal breath sounds.  ?Musculoskeletal:     ?   General: Tenderness present.  ?   Cervical back: Normal range of motion and neck supple. No tenderness.  ?   Comments: Pain over medial joint line, and patellofemoral joint line ?Full extension, full flexion ?Crepitus with Rom under patella  ?Skin: ?   General: Skin is warm and dry.  ?   Capillary Refill: Capillary refill takes less than 2 seconds.  ?Neurological:  ?   General: No focal deficit present.  ?   Mental Status: He is alert and oriented to person, place, and time.  ?Psychiatric:     ?   Mood and Affect: Mood normal.     ?   Behavior: Behavior normal.     ?   Thought Content: Thought content normal.     ?   Judgment: Judgment normal.  ? ? ?Vital signs in last 24 hours: ?Temp:  [98.9 ?F (37.2 ?C)] 98.9 ?F (37.2 ?C) (03/24 4765) ?Pulse Rate:  [72] 72 (03/24 0956) ?Resp:  [16] 16 (03/24 0956) ?BP: (146)/(83) 146/83 (03/24 0956) ?SpO2:  [97 %] 97 % (03/24 0956) ?Weight:  [89.8 kg] 89.8 kg (03/24 0956) ? ?Labs: ? ? ?Estimated body mass index is 28.41 kg/m? as calculated from the following: ?  Height as of 05/11/21: '5\' 10"'$  (1.778 m). ?  Weight as of 05/11/21: 89.8 kg. ? ? ?Imaging Review ?Plain radiographs demonstrate severe degenerative joint disease of the left patellofemoral joint.. The bone quality appears to be good for age and reported activity level. ? ? ? ? ? ?Assessment/Plan: ? ?End stage arthritis, left knee  ? ?The patient history, physical examination, clinical judgment of the provider and imaging studies are consistent with end stage degenerative joint disease of the left patellofemoral joint and medial meniscus tear and partial knee arthroplasty is deemed medically necessary. The treatment options including medical management, injection therapy arthroscopy and arthroplasty were discussed at length. The risks and benefits of partial knee arthroplasty were presented and reviewed. The risks due to aseptic loosening, infection, stiffness, patella  tracking problems, thromboembolic complications and other imponderables were discussed. The patient acknowledged the explanation, agreed to proceed with the plan and consent was signed. Patient is being treated for outpatient replacement with possibility if needed to go inpatient. The patient is planning to be discharged  home with outpatient PT ? ? ? ? ?

## 2021-05-30 ENCOUNTER — Encounter (HOSPITAL_COMMUNITY)
Admission: RE | Disposition: A | Discharge status: Home / Self Care | Payer: Self-pay | Source: Ambulatory Visit | Attending: Specialist

## 2021-05-30 ENCOUNTER — Other Ambulatory Visit: Payer: Self-pay

## 2021-05-30 ENCOUNTER — Ambulatory Visit (HOSPITAL_COMMUNITY)
Admission: RE | Admit: 2021-05-30 | Discharge: 2021-05-30 | Disposition: A | Discharge status: Home / Self Care | Payer: BC Managed Care – PPO | Source: Ambulatory Visit | Attending: Specialist | Admitting: Specialist

## 2021-05-30 ENCOUNTER — Ambulatory Visit (HOSPITAL_COMMUNITY): Payer: BC Managed Care – PPO | Admitting: Anesthesiology

## 2021-05-30 ENCOUNTER — Encounter (HOSPITAL_COMMUNITY): Payer: Self-pay | Admitting: Specialist

## 2021-05-30 DIAGNOSIS — Z79899 Other long term (current) drug therapy: Secondary | ICD-10-CM | POA: Insufficient documentation

## 2021-05-30 DIAGNOSIS — K219 Gastro-esophageal reflux disease without esophagitis: Secondary | ICD-10-CM | POA: Insufficient documentation

## 2021-05-30 DIAGNOSIS — M1712 Unilateral primary osteoarthritis, left knee: Secondary | ICD-10-CM | POA: Insufficient documentation

## 2021-05-30 DIAGNOSIS — Z7982 Long term (current) use of aspirin: Secondary | ICD-10-CM | POA: Diagnosis not present

## 2021-05-30 DIAGNOSIS — Z87891 Personal history of nicotine dependence: Secondary | ICD-10-CM | POA: Diagnosis not present

## 2021-05-30 DIAGNOSIS — M11262 Other chondrocalcinosis, left knee: Secondary | ICD-10-CM | POA: Diagnosis not present

## 2021-05-30 DIAGNOSIS — Z791 Long term (current) use of non-steroidal anti-inflammatories (NSAID): Secondary | ICD-10-CM | POA: Insufficient documentation

## 2021-05-30 DIAGNOSIS — S83242A Other tear of medial meniscus, current injury, left knee, initial encounter: Secondary | ICD-10-CM | POA: Diagnosis not present

## 2021-05-30 DIAGNOSIS — X58XXXA Exposure to other specified factors, initial encounter: Secondary | ICD-10-CM | POA: Insufficient documentation

## 2021-05-30 HISTORY — PX: KNEE ARTHROSCOPY WITH MEDIAL MENISECTOMY: SHX5651

## 2021-05-30 SURGERY — ARTHROSCOPY, KNEE, WITH MEDIAL MENISCECTOMY
Anesthesia: General | Site: Knee | Laterality: Left

## 2021-05-30 MED ORDER — ORAL CARE MOUTH RINSE: 15.0000 mL | Freq: Once | OROMUCOSAL | Status: AC

## 2021-05-30 MED ORDER — FENTANYL CITRATE PF 50 MCG/ML IJ SOSY
50.0000 ug | PREFILLED_SYRINGE | INTRAMUSCULAR | Status: DC
  Administered 2021-05-30: 100 ug via INTRAVENOUS
  Filled 2021-05-30: qty 2

## 2021-05-30 MED ORDER — DEXAMETHASONE SODIUM PHOSPHATE 10 MG/ML IJ SOLN
INTRAMUSCULAR | Status: DC | PRN
  Administered 2021-05-30: 10 mg

## 2021-05-30 MED ORDER — LACTATED RINGERS IV SOLN
INTRAVENOUS | Status: DC
Start: 2021-05-30 — End: 2021-05-30

## 2021-05-30 MED ORDER — ONDANSETRON HCL 4 MG/2ML IJ SOLN: 4.0000 mg | Freq: Once | INTRAMUSCULAR | Status: DC | PRN

## 2021-05-30 MED ORDER — CLINDAMYCIN PHOSPHATE 900 MG/50ML IV SOLN: 900.0000 mg | INTRAVENOUS | Status: DC

## 2021-05-30 MED ORDER — ACETAMINOPHEN 160 MG/5ML PO SOLN: 325.0000 mg | ORAL | Status: DC | PRN

## 2021-05-30 MED ORDER — DEXAMETHASONE SODIUM PHOSPHATE 10 MG/ML IJ SOLN
INTRAMUSCULAR | Status: DC | PRN
  Administered 2021-05-30: 8 mg via INTRAVENOUS

## 2021-05-30 MED ORDER — ROPIVACAINE HCL 7.5 MG/ML IJ SOLN
INTRAMUSCULAR | Status: DC | PRN
  Administered 2021-05-30: 30 mL via PERINEURAL

## 2021-05-30 MED ORDER — SODIUM CHLORIDE (PF) 0.9 % IJ SOLN
INTRAMUSCULAR | Status: AC
  Filled 2021-05-30: qty 50

## 2021-05-30 MED ORDER — PHENYLEPHRINE HCL-NACL 20-0.9 MG/250ML-% IV SOLN
INTRAVENOUS | Status: DC | PRN
  Administered 2021-05-30: 25 ug/min via INTRAVENOUS

## 2021-05-30 MED ORDER — SODIUM CHLORIDE 0.9 % IR SOLN
Status: DC | PRN
  Administered 2021-05-30: 6000 mL

## 2021-05-30 MED ORDER — BUPIVACAINE LIPOSOME 1.3 % IJ SUSP
INTRAMUSCULAR | Status: AC
  Filled 2021-05-30: qty 20

## 2021-05-30 MED ORDER — TRANEXAMIC ACID-NACL 1000-0.7 MG/100ML-% IV SOLN
1000.0000 mg | INTRAVENOUS | Status: AC
  Administered 2021-05-30: 1000 mg via INTRAVENOUS
  Filled 2021-05-30: qty 100

## 2021-05-30 MED ORDER — PROPOFOL 500 MG/50ML IV EMUL
INTRAVENOUS | Status: DC | PRN
  Administered 2021-05-30: 50 ug/kg/min via INTRAVENOUS

## 2021-05-30 MED ORDER — OXYCODONE HCL 5 MG/5ML PO SOLN: 5.0000 mg | Freq: Once | ORAL | Status: DC | PRN

## 2021-05-30 MED ORDER — PHENYLEPHRINE HCL (PRESSORS) 10 MG/ML IV SOLN
INTRAVENOUS | Status: AC
  Filled 2021-05-30: qty 1

## 2021-05-30 MED ORDER — POVIDONE-IODINE 10 % EX SWAB
2.0000 "application " | Freq: Once | CUTANEOUS | Status: AC
  Administered 2021-05-30: 2 via TOPICAL

## 2021-05-30 MED ORDER — OXYCODONE HCL 5 MG PO TABS: 5.0000 mg | ORAL_TABLET | Freq: Once | ORAL | Status: DC | PRN

## 2021-05-30 MED ORDER — CLONIDINE HCL (ANALGESIA) 100 MCG/ML EP SOLN
EPIDURAL | Status: DC | PRN
  Administered 2021-05-30: 100 ug

## 2021-05-30 MED ORDER — ACETAMINOPHEN 325 MG PO TABS: 325.0000 mg | ORAL_TABLET | ORAL | Status: DC | PRN

## 2021-05-30 MED ORDER — MIDAZOLAM HCL 2 MG/2ML IJ SOLN
1.0000 mg | INTRAMUSCULAR | Status: DC
  Administered 2021-05-30: 2 mg via INTRAVENOUS
  Filled 2021-05-30: qty 2

## 2021-05-30 MED ORDER — CLINDAMYCIN HCL 300 MG PO CAPS: 300.0000 mg | ORAL_CAPSULE | Freq: Three times a day (TID) | ORAL | 0 refills | Status: AC

## 2021-05-30 MED ORDER — CLINDAMYCIN PHOSPHATE 900 MG/50ML IV SOLN
INTRAVENOUS | Status: AC
  Filled 2021-05-30: qty 50

## 2021-05-30 MED ORDER — DEXAMETHASONE SODIUM PHOSPHATE 10 MG/ML IJ SOLN
INTRAMUSCULAR | Status: AC
  Filled 2021-05-30: qty 1

## 2021-05-30 MED ORDER — ONDANSETRON HCL 4 MG/2ML IJ SOLN
INTRAMUSCULAR | Status: DC | PRN
  Administered 2021-05-30: 4 mg via INTRAVENOUS

## 2021-05-30 MED ORDER — METHOCARBAMOL 500 MG PO TABS: 500.0000 mg | ORAL_TABLET | Freq: Four times a day (QID) | ORAL | 0 refills | Status: AC

## 2021-05-30 MED ORDER — VANCOMYCIN HCL IN DEXTROSE 1-5 GM/200ML-% IV SOLN
1000.0000 mg | INTRAVENOUS | Status: AC
  Administered 2021-05-30: 1000 mg via INTRAVENOUS
  Filled 2021-05-30: qty 200

## 2021-05-30 MED ORDER — HYDROMORPHONE HCL 2 MG PO TABS: 2.0000 mg | ORAL_TABLET | ORAL | 0 refills | Status: AC | PRN

## 2021-05-30 MED ORDER — SODIUM CHLORIDE (PF) 0.9 % IJ SOLN
INTRAMUSCULAR | Status: AC
  Filled 2021-05-30: qty 10

## 2021-05-30 MED ORDER — ONDANSETRON HCL 4 MG PO TABS: 4.0000 mg | ORAL_TABLET | Freq: Every day | ORAL | 1 refills | Status: AC | PRN

## 2021-05-30 MED ORDER — ONDANSETRON HCL 4 MG/2ML IJ SOLN
INTRAMUSCULAR | Status: AC
  Filled 2021-05-30: qty 2

## 2021-05-30 MED ORDER — BUPIVACAINE IN DEXTROSE 0.75-8.25 % IT SOLN
INTRATHECAL | Status: DC | PRN
  Administered 2021-05-30: 1.6 mL via INTRATHECAL

## 2021-05-30 MED ORDER — FENTANYL CITRATE PF 50 MCG/ML IJ SOSY: 25.0000 ug | PREFILLED_SYRINGE | INTRAMUSCULAR | Status: DC | PRN

## 2021-05-30 MED ORDER — CHLORHEXIDINE GLUCONATE 0.12 % MT SOLN
15.0000 mL | Freq: Once | OROMUCOSAL | Status: AC
  Administered 2021-05-30: 15 mL via OROMUCOSAL

## 2021-05-30 MED ORDER — PROPOFOL 1000 MG/100ML IV EMUL
INTRAVENOUS | Status: AC
  Filled 2021-05-30: qty 100

## 2021-05-30 MED ORDER — MEPERIDINE HCL 50 MG/ML IJ SOLN: 6.2500 mg | INTRAMUSCULAR | Status: DC | PRN

## 2021-05-30 SURGICAL SUPPLY — 69 items
BAG COUNTER SPONGE SURGICOUNT (BAG) ×2 IMPLANT
BAG ZIPLOCK 12X15 (MISCELLANEOUS) ×4 IMPLANT
BANDAGE ESMARK 6X9 LF (GAUZE/BANDAGES/DRESSINGS) IMPLANT
BLADE EXCALIBUR 4.0X13 (MISCELLANEOUS) ×2 IMPLANT
BNDG ELASTIC 4X5.8 VLCR STR LF (GAUZE/BANDAGES/DRESSINGS) ×2 IMPLANT
BNDG ELASTIC 6X5.8 VLCR STR LF (GAUZE/BANDAGES/DRESSINGS) ×2 IMPLANT
BNDG ESMARK 6X9 LF (GAUZE/BANDAGES/DRESSINGS)
BNDG GAUZE ELAST 4 BULKY (GAUZE/BANDAGES/DRESSINGS) ×4 IMPLANT
BOWL SMART MIX CTS (DISPOSABLE) ×2 IMPLANT
CANISTER SUCT 3000ML PPV (MISCELLANEOUS) ×2 IMPLANT
CEMENT HV SMART SET (Cement) ×1 IMPLANT
COVER SURGICAL LIGHT HANDLE (MISCELLANEOUS) ×2 IMPLANT
CUFF TOURN SGL QUICK 34 (TOURNIQUET CUFF) ×2
CUFF TRNQT CYL 34X4.125X (TOURNIQUET CUFF) ×1 IMPLANT
DRAPE INCISE 23X17 IOBAN STRL (DRAPES) ×1
DRAPE INCISE 23X17 STRL (DRAPES) ×1 IMPLANT
DRAPE INCISE IOBAN 23X17 STRL (DRAPES) ×1 IMPLANT
DRAPE INCISE IOBAN 66X45 STRL (DRAPES) ×6 IMPLANT
DRAPE SHEET LG 3/4 BI-LAMINATE (DRAPES) ×2 IMPLANT
DRAPE U-SHAPE 47X51 STRL (DRAPES) ×2 IMPLANT
DRSG PAD ABDOMINAL 8X10 ST (GAUZE/BANDAGES/DRESSINGS) ×4 IMPLANT
DURAPREP 26ML APPLICATOR (WOUND CARE) ×4 IMPLANT
EXCALIBUR 3.8MM X 13CM (MISCELLANEOUS) ×2 IMPLANT
GAUZE SPONGE 4X4 12PLY STRL (GAUZE/BANDAGES/DRESSINGS) ×2 IMPLANT
GAUZE XEROFORM 1X8 LF (GAUZE/BANDAGES/DRESSINGS) ×2 IMPLANT
GLOVE BIOGEL PI IND STRL 7.5 (GLOVE) ×1 IMPLANT
GLOVE BIOGEL PI IND STRL 8 (GLOVE) ×1 IMPLANT
GLOVE BIOGEL PI INDICATOR 7.5 (GLOVE) ×1
GLOVE BIOGEL PI INDICATOR 8 (GLOVE) ×1
GLOVE SKINSENSE NS SZ8.0 LF (GLOVE) ×1
GLOVE SKINSENSE STRL SZ8.0 LF (GLOVE) ×1 IMPLANT
GLOVE SURG ORTHO 9.0 STRL STRW (GLOVE) ×2 IMPLANT
GLOVE SURG SS PI 7.0 STRL IVOR (GLOVE) ×2 IMPLANT
GOWN STRL REUS W/ TWL LRG LVL3 (GOWN DISPOSABLE) ×1 IMPLANT
GOWN STRL REUS W/TWL LRG LVL3 (GOWN DISPOSABLE) ×2
HANDPIECE INTERPULSE COAX TIP (DISPOSABLE) ×2
KIT BASIN OR (CUSTOM PROCEDURE TRAY) ×2 IMPLANT
KIT TURNOVER KIT A (KITS) IMPLANT
MANIFOLD NEPTUNE II (INSTRUMENTS) IMPLANT
NS IRRIG 1000ML POUR BTL (IV SOLUTION) ×4 IMPLANT
PACK ARTHROSCOPY DSU (CUSTOM PROCEDURE TRAY) ×2 IMPLANT
PACK TOTAL KNEE CUSTOM (KITS) ×2 IMPLANT
PAD ARMBOARD 7.5X6 YLW CONV (MISCELLANEOUS) IMPLANT
PADDING CAST ABS 4INX4YD NS (CAST SUPPLIES) ×1
PADDING CAST ABS COTTON 4X4 ST (CAST SUPPLIES) ×1 IMPLANT
PROTECTOR NERVE ULNAR (MISCELLANEOUS) ×2 IMPLANT
SET HNDPC FAN SPRY TIP SCT (DISPOSABLE) ×1 IMPLANT
SPONGE SURGIFOAM ABS GEL 100 (HEMOSTASIS) IMPLANT
STOCKINETTE 6  STRL (DRAPES) ×2
STOCKINETTE 6 STRL (DRAPES) ×1 IMPLANT
STRIP CLOSURE SKIN 1/2X4 (GAUZE/BANDAGES/DRESSINGS) ×2 IMPLANT
SUT BONE WAX W31G (SUTURE) IMPLANT
SUT ETHILON 4 0 PS 2 18 (SUTURE) ×2 IMPLANT
SUT MNCRL AB 3-0 PS2 18 (SUTURE) ×2 IMPLANT
SUT VIC AB 1 CT1 27 (SUTURE) ×6
SUT VIC AB 1 CT1 27XBRD ANTBC (SUTURE) ×3 IMPLANT
SUT VIC AB 2-0 CT1 27 (SUTURE) ×4
SUT VIC AB 2-0 CT1 TAPERPNT 27 (SUTURE) ×2 IMPLANT
SUT VLOC 180 0 24IN GS25 (SUTURE) ×2 IMPLANT
SYR CONTROL 10ML LL (SYRINGE) ×2 IMPLANT
TAPE STRIPS DRAPE STRL (GAUZE/BANDAGES/DRESSINGS) ×2 IMPLANT
TOWEL OR 17X26 10 PK STRL BLUE (TOWEL DISPOSABLE) ×4 IMPLANT
TRAY FOLEY MTR SLVR 16FR STAT (SET/KITS/TRAYS/PACK) ×1 IMPLANT
TUBING ARTHROSCOPY IRRIG 16FT (MISCELLANEOUS) ×2 IMPLANT
TUBING CONNECTING 10 (TUBING) ×2 IMPLANT
WAND APOLLORF SJ50 AR-9845 (SURGICAL WAND) ×2 IMPLANT
WATER STERILE IRR 1000ML POUR (IV SOLUTION) ×2 IMPLANT
WATER STERILE IRR 500ML POUR (IV SOLUTION) ×2 IMPLANT
WRAP KNEE MAXI GEL POST OP (GAUZE/BANDAGES/DRESSINGS) ×2 IMPLANT

## 2021-05-30 NOTE — Progress Notes (Signed)
Assisted Dr. Janeece Riggers with left Knee Adductor Canal block. Side rails up, monitors on throughout procedure. See vital signs in flow sheet. Tolerated Procedure well. ? ?

## 2021-05-30 NOTE — Transfer of Care (Signed)
Immediate Anesthesia Transfer of Care Note ? ?Patient: Jerome Shepard ? ?Procedure(s) Performed: KNEE ARTHROSCOPY WITH PARTIAL MEDIAL MENISECTOMY;CHONDROPLASTY (Left: Knee) ?PATELLA-FEMORAL ARTHROPLASTY (Left: Knee) ? ?Patient Location: PACU ? ?Anesthesia Type:MAC and Spinal ? ?Level of Consciousness: awake, alert , oriented and patient cooperative ? ?Airway & Oxygen Therapy: Patient Spontanous Breathing and Patient connected to face mask oxygen ? ?Post-op Assessment: Report given to RN and Post -op Vital signs reviewed and stable ? ?Post vital signs: Reviewed and stable ? ?Last Vitals:  ?Vitals Value Taken Time  ?BP 92/56 05/30/21 1315  ?Temp    ?Pulse 59 05/30/21 1316  ?Resp 10 05/30/21 1316  ?SpO2 99 % 05/30/21 1316  ?Vitals shown include unvalidated device data. ? ?Last Pain:  ?Vitals:  ? 05/30/21 1014  ?TempSrc:   ?PainSc: 0-No pain  ?   ? ?Patients Stated Pain Goal: 3 (05/30/21 1014) ? ?Complications: No notable events documented. ?

## 2021-05-30 NOTE — Anesthesia Procedure Notes (Signed)
Anesthesia Regional Block: Adductor canal block  ? ?Pre-Anesthetic Checklist: , timeout performed,  Correct Patient, Correct Site, Correct Laterality,  Correct Procedure, Correct Position, site marked,  Risks and benefits discussed,  Surgical consent,  Pre-op evaluation,  At surgeon's request and post-op pain management ? ?Laterality: Left ? ?Prep: chloraprep     ?  ?Needles:  ?Injection technique: Single-shot ? ?Needle Type: Echogenic Stimulator Needle   ? ? ?Needle Length: 5cm  ?Needle Gauge: 22  ? ? ? ?Additional Needles: ? ? ?Procedures:, nerve stimulator,,, ultrasound used (permanent image in chart),,    ?Narrative:  ?Start time: 05/30/2021 10:50 AM ?End time: 05/30/2021 10:55 AM ?Injection made incrementally with aspirations every 5 mL. ? ?Performed by: Personally  ?Anesthesiologist: Janeece Riggers, MD ? ?Additional Notes: ?Functioning IV was confirmed and monitors were applied.  A 57m 22ga Arrow echogenic stimulator needle was used. Sterile prep and drape,hand hygiene and sterile gloves were used. Ultrasound guidance: relevant anatomy identified, needle position confirmed, local anesthetic spread visualized around nerve(s)., vascular puncture avoided.  Image printed for medical record. Negative aspiration and negative test dose prior to incremental administration of local anesthetic. The patient tolerated the procedure well. ? ? ? ? ? ?

## 2021-05-30 NOTE — Interval H&P Note (Signed)
History and Physical Interval Note: ? ?05/30/2021 ?10:44 AM ? ?Jerome Shepard  has presented today for surgery, with the diagnosis of Left patellofemoral osteoarthritis.  The various methods of treatment have been discussed with the patient and family. After consideration of risks, benefits and other options for treatment, the patient has consented to  Procedure(s) with comments: ?KNEE ARTHROSCOPY WITH PARTIAL MEDIAL MENISECTOMY;CHONDROPLASTY (Left) ?PATELLA-FEMORAL ARTHROPLASTY (Left) - with adductor canal as a surgical intervention.  The patient's history has been reviewed, patient examined, no change in status, stable for surgery.  I have reviewed the patient's chart and labs.  Questions were answered to the patient's satisfaction.   ? ? ?Jerome Shepard ANDREW ? ? ?

## 2021-05-30 NOTE — Op Note (Signed)
Preop diagnosis #1 left knee patellofemoral osteoarthritis end-stage #2 left knee posterior horn tear medial meniscus ?Postop diagnosis #1 left knee tricompartmental advanced osteoarthritis.  #2 posterior tear medial meniscus with chondrocalcinosis ?Procedure 1 left knee arthroscopy with partial meniscectomy and tricompartmental chondroplasty ?Surgeon Hart Robinsons, MD ?Assistant Elenor Legato, PA-C ?Anesthesia spinal with abductor nerve block ?Tourniquet time 30 minutes ?Drains none ?Estimated blood loss minimal ?Complications none ?Disposition PACU stable ? ?Operative details ?Patient was encountered in the holding a correct side identified marked signed appropriately.  Abductor nerve block was administered.  IV antibiotics were given within 1 hour the surgical incision time.  Take the operating room with a spinal anesthetic was administered..  All extremities were well-padded.  Left posterior elevated prepped with DuraPrep and draped into a sterile fashion.  Timeout was done confirm the left side.  Exsanguinated Esmarch tourniquet inflated to 325 mmHg.  Arthroscopic portals were established inferomedial inferolateral diagnostic arthroscopy patellofemoral joint revealed grade 4 changes the patella lateral femoral trochlea with unstable chondral flaps light chondroplasty was formed.  ACL showed degeneration but was intact.  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  Grade II chondromalacia lateral femoral condyle lateral tibial plateau again grade 4 changes.  Lateral meniscus was intact.  Medial side was inspected grade 3 or even grade 4 changes medial femoral condyle unstable chondral flaps light chondroplasty was performed.  Medial meniscus was inspected found to have chondrocalcinosis and a complex tear of the posterior one third utilizing basket motorized shaver cautery a partial meniscectomy was  formed back to stable base.  There is no other abnormalities noted.  Arthroscopic root was removed portals closed 1 suture.  Tourniquet deflated. ? ?At this point time the patient had been scheduled for patellofemoral arthroplasty.  However due to the current status of the knee with grade 4 changes tricompartmental I felt that was contraindicated.Marland Kitchen  He will be discharged to the PACU after In-N-Out cath in the operating room.  Stable discharge to home.  I explained the patient and his wife the current situation I recommend I will recommend a total knee arthroplasty.Marland Kitchen ?

## 2021-05-30 NOTE — Interval H&P Note (Signed)
History and Physical Interval Note: ? ?05/30/2021 ?10:44 AM ? ?Jerome Shepard  has presented today for surgery, with the diagnosis of Left patellofemoral osteoarthritis.  The various methods of treatment have been discussed with the patient and family. After consideration of risks, benefits and other options for treatment, the patient has consented to  Procedure(s) with comments: ?KNEE ARTHROSCOPY WITH PARTIAL MEDIAL MENISECTOMY;CHONDROPLASTY (Left) ?PATELLA-FEMORAL ARTHROPLASTY (Left) - with adductor canal as a surgical intervention.  The patient's history has been reviewed, patient examined, no change in status, stable for surgery.  I have reviewed the patient's chart and labs.  Questions were answered to the patient's satisfaction.   ? ? ?Brennan Litzinger ANDREW ? ? ?

## 2021-05-30 NOTE — Anesthesia Preprocedure Evaluation (Signed)
Anesthesia Evaluation  ?Patient identified by MRN, date of birth, ID band ?Patient awake ? ? ? ?Reviewed: ?Allergy & Precautions, NPO status , Patient's Chart, lab work & pertinent test results ? ?Airway ?Mallampati: II ? ?TM Distance: >3 FB ?Neck ROM: Full ? ? ? Dental ? ?(+) Teeth Intact, Dental Advisory Given ?  ?Pulmonary ?neg pulmonary ROS,  ?  ?Pulmonary exam normal ? ? ? ? ? ? ? Cardiovascular ?negative cardio ROS ? ? ?Rhythm:Regular Rate:Normal ? ? ?  ?Neuro/Psych ?negative neurological ROS ? negative psych ROS  ? GI/Hepatic ?negative GI ROS, Neg liver ROS, GERD  Medicated,Umbilical hernia, left inguinal lipoma ?  ?Endo/Other  ?negative endocrine ROS ? Renal/GU ?negative Renal ROS Bladder dysfunction ? ? ?negative genitourinary ?  ?Musculoskeletal ?negative musculoskeletal ROS ?(+)  ? Abdominal ?(+)  ?Abdomen: soft. ?Bowel sounds: normal.  ?Peds ?negative pediatric ROS ?(+)  Hematology ?negative hematology ROS ?(+)   ?Anesthesia Other Findings ? ? Reproductive/Obstetrics ?negative OB ROS ? ?  ? ? ? ? ? ? ? ? ? ? ? ? ? ?  ?  ? ? ? ? ? ? ? ? ?Anesthesia Physical ? ?Anesthesia Plan ? ?ASA: 2 ? ?Anesthesia Plan: General  ? ?Post-op Pain Management: Minimal or no pain anticipated  ? ?Induction: Intravenous ? ?PONV Risk Score and Plan: 2 and Ondansetron, Dexamethasone and Treatment may vary due to age or medical condition ? ?Airway Management Planned: Natural Airway, Nasal Cannula, Simple Face Mask and Mask ? ?Additional Equipment: None ? ?Intra-op Plan:  ? ?Post-operative Plan: Extubation in OR ? ?Informed Consent: I have reviewed the patients History and Physical, chart, labs and discussed the procedure including the risks, benefits and alternatives for the proposed anesthesia with the patient or authorized representative who has indicated his/her understanding and acceptance.  ? ? ? ?Dental advisory given ? ?Plan Discussed with: CRNA, Surgeon and Anesthesiologist ? ?Anesthesia  Plan Comments:   ? ? ? ? ? ? ?Anesthesia Quick Evaluation ? ?

## 2021-05-30 NOTE — Anesthesia Procedure Notes (Signed)
Spinal ? ?Patient location during procedure: OR ?Start time: 05/30/2021 12:06 PM ?End time: 05/30/2021 12:07 PM ?Reason for block: surgical anesthesia ?Staffing ?Anesthesiologist: Janeece Riggers, MD ?Preanesthetic Checklist ?Completed: patient identified, IV checked, site marked, risks and benefits discussed, surgical consent, monitors and equipment checked, pre-op evaluation and timeout performed ?Spinal Block ?Patient position: sitting ?Prep: DuraPrep ?Patient monitoring: heart rate, cardiac monitor, continuous pulse ox and blood pressure ?Approach: midline ?Location: L3-4 ?Injection technique: single-shot ?Needle ?Needle type: Sprotte  ?Needle gauge: 24 G ?Needle length: 9 cm ?Assessment ?Sensory level: T4 ?Events: CSF return ? ? ? ?

## 2021-05-30 NOTE — Anesthesia Postprocedure Evaluation (Signed)
Anesthesia Post Note ? ?Patient: Vedanth Sirico ? ?Procedure(s) Performed: KNEE ARTHROSCOPY WITH PARTIAL MEDIAL MENISECTOMY;CHONDROPLASTY (Left: Knee) ? ?  ? ?Patient location during evaluation: PACU ?Anesthesia Type: General ?Level of consciousness: awake and alert ?Pain management: pain level controlled ?Vital Signs Assessment: post-procedure vital signs reviewed and stable ?Respiratory status: spontaneous breathing, nonlabored ventilation, respiratory function stable and patient connected to nasal cannula oxygen ?Cardiovascular status: blood pressure returned to baseline and stable ?Postop Assessment: no apparent nausea or vomiting ?Anesthetic complications: no ? ? ?No notable events documented. ? ?Last Vitals:  ?Vitals:  ? 05/30/21 1315 05/30/21 1320  ?BP: (!) 92/56   ?Pulse: 64 60  ?Resp: 13 12  ?Temp:    ?SpO2: 100% 96%  ?  ?Last Pain:  ?Vitals:  ? 05/30/21 1315  ?TempSrc:   ?PainSc: 0-No pain  ? ? ?  ?  ?  ?  ?  ?  ? ?Maricela Kawahara ? ? ? ? ?

## 2021-05-31 ENCOUNTER — Encounter (HOSPITAL_COMMUNITY): Payer: Self-pay | Admitting: Specialist

## 2021-10-23 IMAGING — CT CT SHOULDER*L* W/O CM
3 series · 15 of 33 positions shown, 18 images · non-contrast
Comparison: None.

CLINICAL DATA: Primary osteoarthritis of the left shoulder with
progressive pain and decreased range of motion.

EXAM:
CT OF THE UPPER LEFT EXTREMITY WITHOUT CONTRAST
TECHNIQUE: Multidetector CT imaging of the upper left extremity was performed
according to the [facility] protocol.

[Series 4: shoulder 2.00 br40 s3 axial soft · axial · 0.45mm/px · z∈[-1037,-873]mm · 7 of 98 slices shown, 9 images]
[im 8/98  soft-tissue]
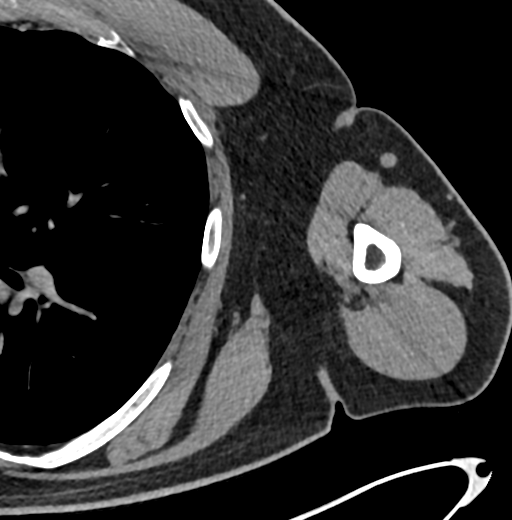
[im 8/98  bone]
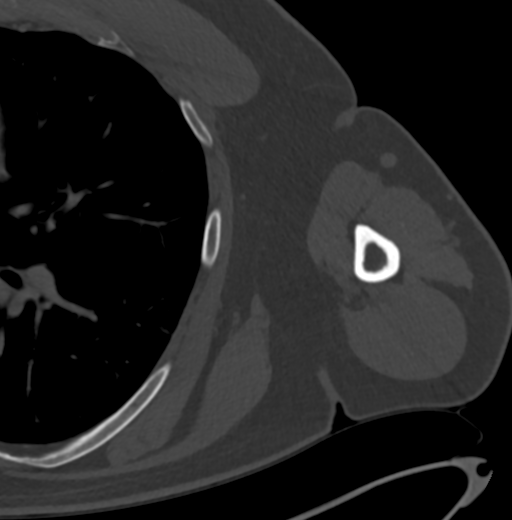
[im 23/98  bone]
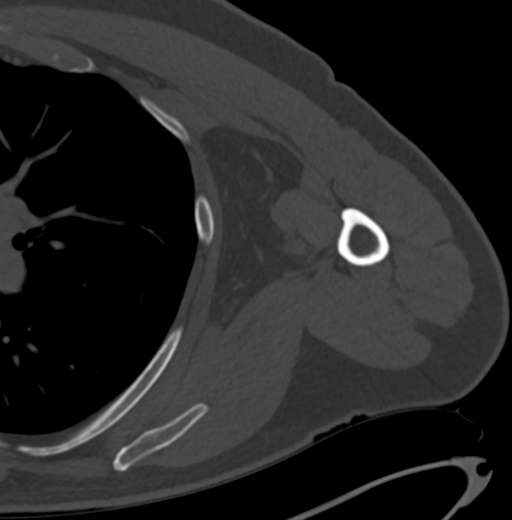
[im 38/98  bone]
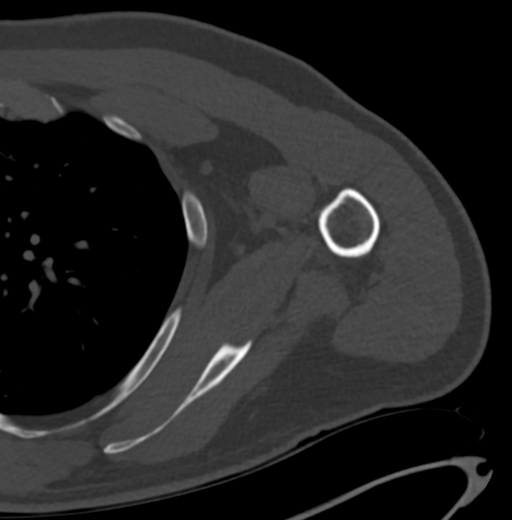
[im 53/98  bone]
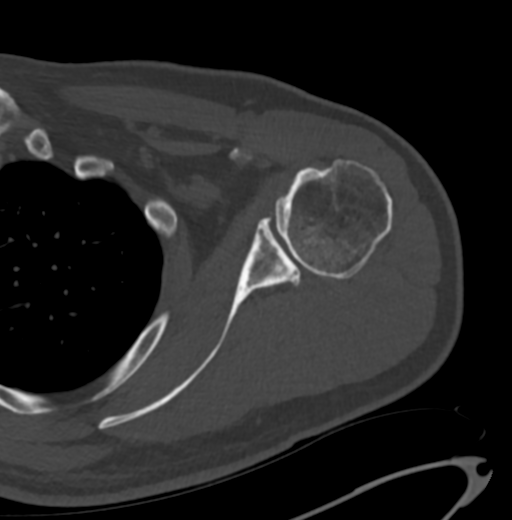
[im 60/98  soft-tissue]
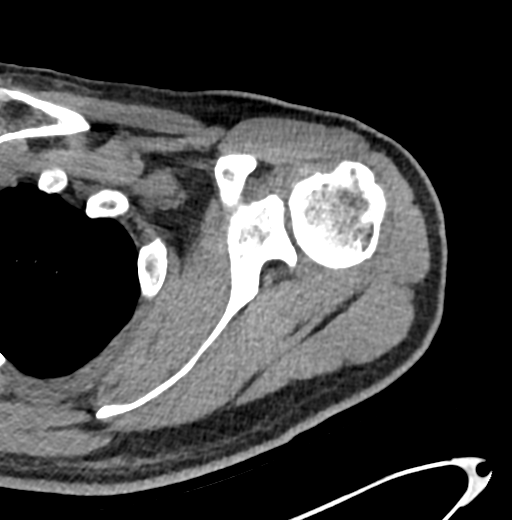
[im 60/98  bone]
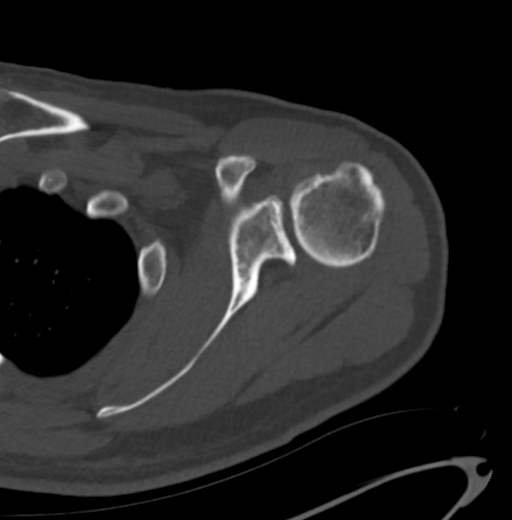
[im 75/98  bone]
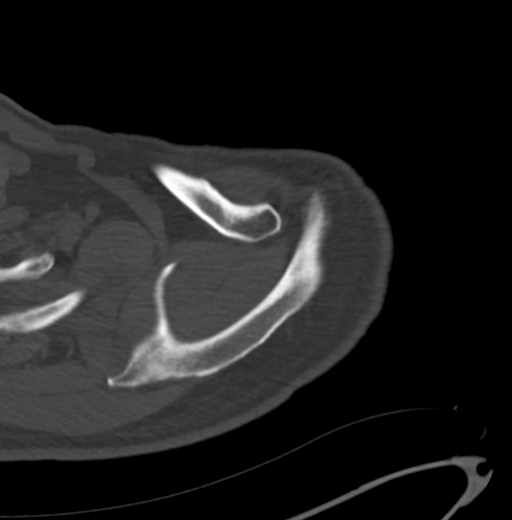
[im 90/98  bone]
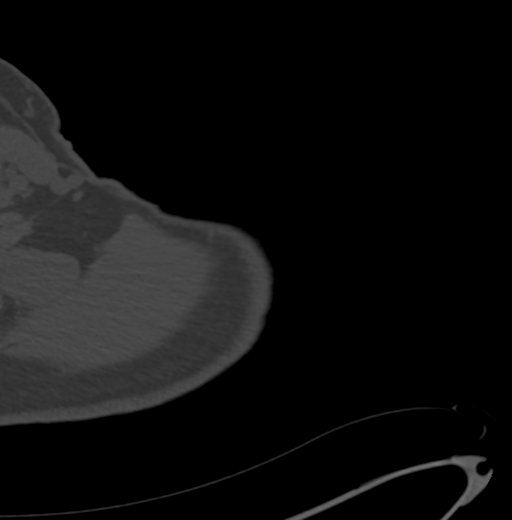

[Series 8: shoulder 2.00 br40 s3 cor soft · coronal · 0.39mm/px · 3 of 113 slices shown]
[im 23/113  bone]
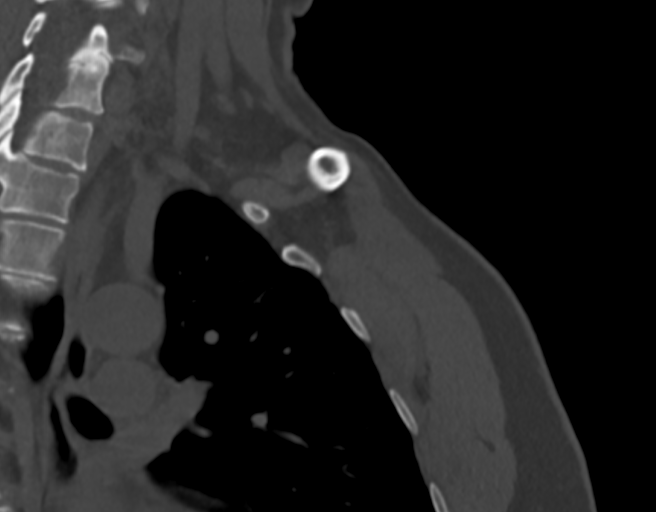
[im 45/113  bone]
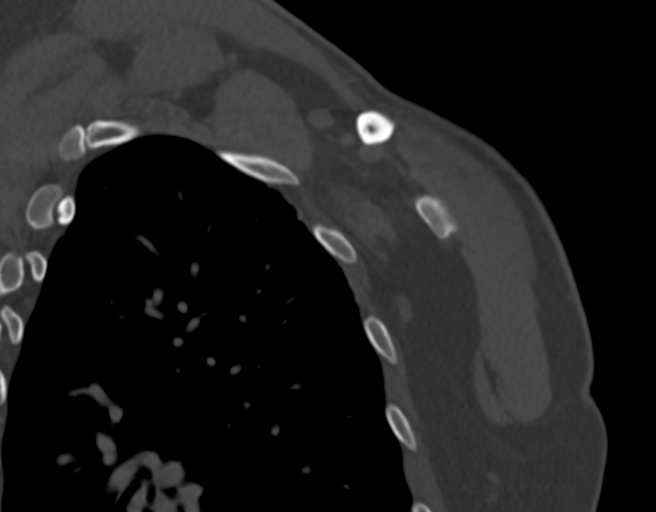
[im 68/113  bone]
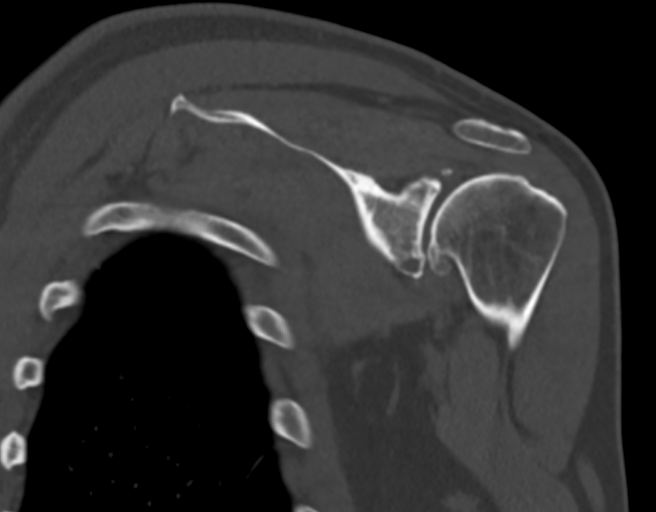

[Series 12: shoulder 2.00 br40 s3 sag soft · sagittal · 0.39mm/px · 5 of 125 slices shown, 6 images]
[im 42/125  bone]
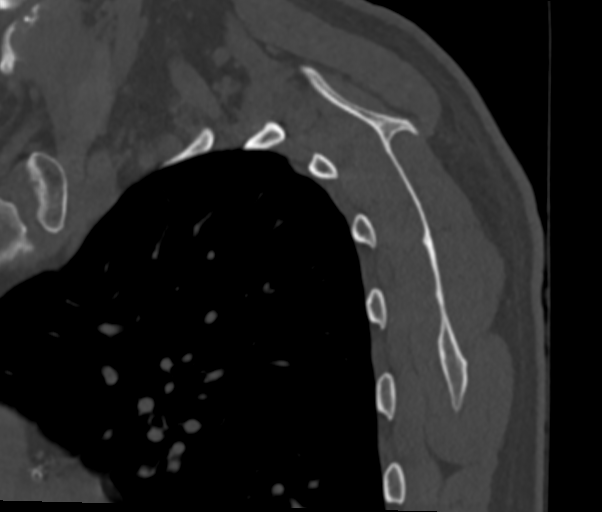
[im 52/125  bone]
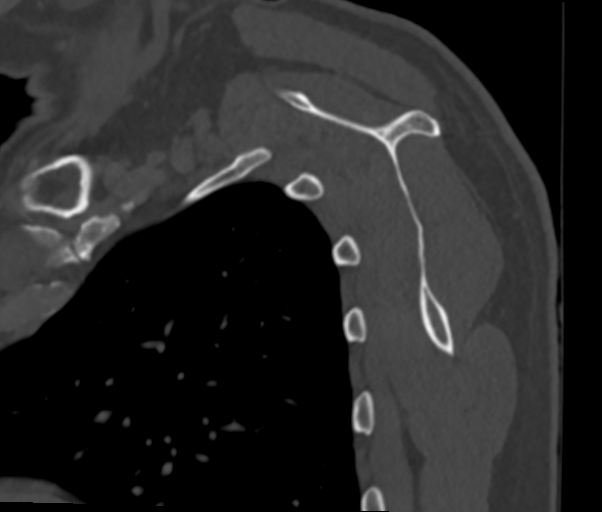
[im 63/125  soft-tissue]
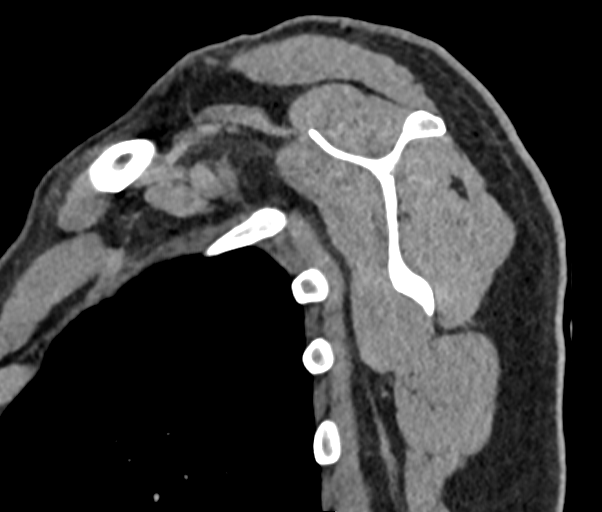
[im 63/125  bone]
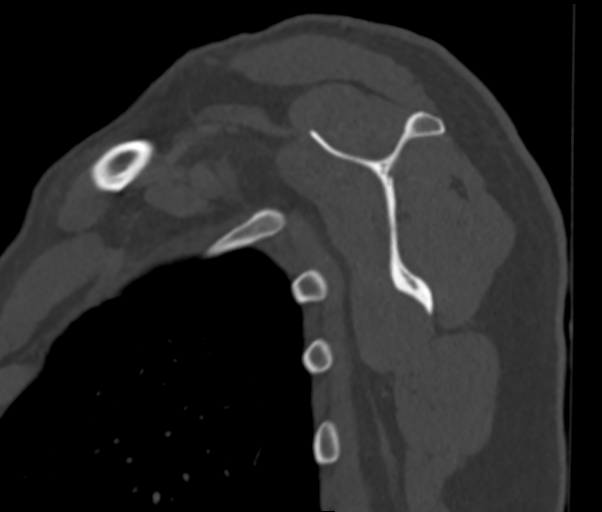
[im 73/125  bone]
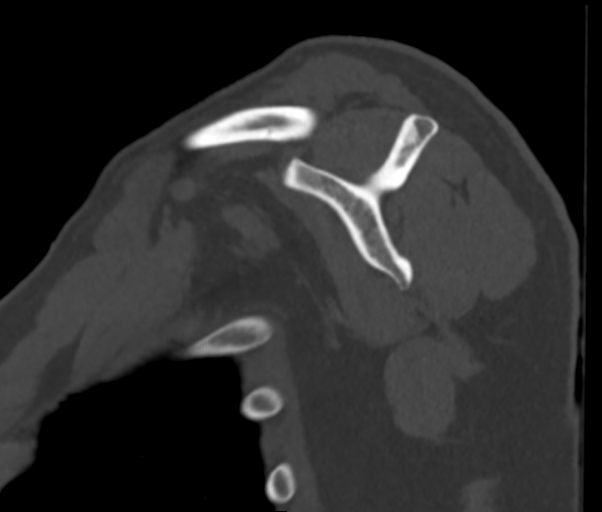
[im 83/125  bone]
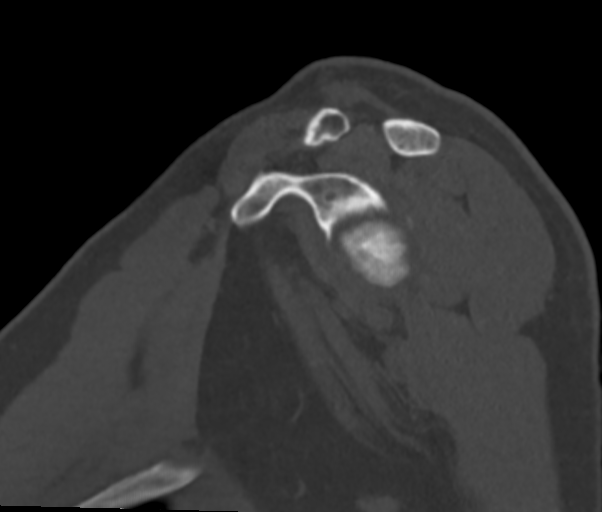

[15 of 33 positions shown; findings below may reference images not displayed]

FINDINGS: Bones/Joint/Cartilage

There is moderately severe osteoarthritis of the glenohumeral joint
with prominent marginal osteophytes on the humeral head, joint space
narrowing, and multiple calcified loose bodies in the glenohumeral
joint.

There are cystic degenerative changes in the inferior aspect of the
glenoid with small marginal osteophytes on the glenoid rim.

No arthritic changes at the AC joint.

Muscles and Tendons

No atrophy of the muscles of the rotator cuff.

Soft tissues

No adenopathy or other significant abnormalities.
IMPRESSION: Moderately severe osteoarthritis of the glenohumeral joint as
described.

## 2021-11-01 ENCOUNTER — Other Ambulatory Visit: Payer: Self-pay | Admitting: Physician Assistant

## 2021-11-01 DIAGNOSIS — M25561 Pain in right knee: Secondary | ICD-10-CM

## 2021-11-02 ENCOUNTER — Ambulatory Visit
Admission: RE | Admit: 2021-11-02 | Discharge: 2021-11-02 | Disposition: A | Discharge status: Home / Self Care | Payer: BC Managed Care – PPO | Source: Ambulatory Visit | Attending: Physician Assistant | Admitting: Physician Assistant

## 2021-11-02 DIAGNOSIS — M25561 Pain in right knee: Secondary | ICD-10-CM

## 2022-08-02 ENCOUNTER — Other Ambulatory Visit (HOSPITAL_COMMUNITY): Payer: Self-pay | Admitting: Specialist

## 2022-08-02 ENCOUNTER — Ambulatory Visit (HOSPITAL_COMMUNITY)
Admission: RE | Admit: 2022-08-02 | Discharge: 2022-08-02 | Disposition: A | Discharge status: Home / Self Care | Payer: Medicare HMO | Source: Ambulatory Visit | Attending: Vascular Surgery | Admitting: Vascular Surgery

## 2022-08-02 DIAGNOSIS — M79604 Pain in right leg: Secondary | ICD-10-CM | POA: Diagnosis present

## 2022-08-02 DIAGNOSIS — M7989 Other specified soft tissue disorders: Secondary | ICD-10-CM | POA: Diagnosis present
